# Patient Record
Sex: Male | Born: 1935
Health system: Southern US, Community
[De-identification: ages and names within clinical notes are randomized; demographics above are authoritative.]

## PROBLEM LIST (undated history)

## (undated) DIAGNOSIS — K449 Diaphragmatic hernia without obstruction or gangrene: Secondary | ICD-10-CM

## (undated) DIAGNOSIS — C679 Malignant neoplasm of bladder, unspecified: Secondary | ICD-10-CM

## (undated) DIAGNOSIS — I2510 Atherosclerotic heart disease of native coronary artery without angina pectoris: Secondary | ICD-10-CM

## (undated) DIAGNOSIS — I10 Essential (primary) hypertension: Secondary | ICD-10-CM

## (undated) DIAGNOSIS — K760 Fatty (change of) liver, not elsewhere classified: Secondary | ICD-10-CM

## (undated) DIAGNOSIS — N183 Chronic kidney disease, stage 3 unspecified: Secondary | ICD-10-CM

## (undated) DIAGNOSIS — I639 Cerebral infarction, unspecified: Secondary | ICD-10-CM

## (undated) DIAGNOSIS — E119 Type 2 diabetes mellitus without complications: Secondary | ICD-10-CM

## (undated) DIAGNOSIS — E7800 Pure hypercholesterolemia, unspecified: Secondary | ICD-10-CM

## (undated) HISTORY — DX: Atherosclerotic heart disease of native coronary artery without angina pectoris: I25.10

## (undated) HISTORY — DX: Fatty (change of) liver, not elsewhere classified: K76.0

## (undated) HISTORY — DX: Diaphragmatic hernia without obstruction or gangrene: K44.9

## (undated) HISTORY — DX: Chronic kidney disease, stage 3 unspecified: N18.30

## (undated) HISTORY — PX: HERNIA REPAIR: SHX51

## (undated) HISTORY — PX: APPENDECTOMY: SHX54

---

## 1999-07-29 ENCOUNTER — Encounter: Admission: RE | Admit: 1999-07-29 | Discharge: 1999-10-27 | Payer: Self-pay | Admitting: Internal Medicine

## 2001-02-12 ENCOUNTER — Ambulatory Visit (HOSPITAL_COMMUNITY): Admission: RE | Admit: 2001-02-12 | Discharge: 2001-02-12 | Payer: Self-pay | Admitting: Internal Medicine

## 2001-02-12 ENCOUNTER — Encounter: Payer: Self-pay | Admitting: Internal Medicine

## 2001-10-16 ENCOUNTER — Ambulatory Visit (HOSPITAL_COMMUNITY): Admission: RE | Admit: 2001-10-16 | Discharge: 2001-10-16 | Payer: Self-pay | Admitting: Internal Medicine

## 2001-11-02 ENCOUNTER — Ambulatory Visit (HOSPITAL_COMMUNITY): Admission: RE | Admit: 2001-11-02 | Discharge: 2001-11-02 | Payer: Self-pay | Admitting: Internal Medicine

## 2001-11-02 ENCOUNTER — Encounter: Payer: Self-pay | Admitting: Internal Medicine

## 2005-02-07 ENCOUNTER — Encounter (INDEPENDENT_AMBULATORY_CARE_PROVIDER_SITE_OTHER): Payer: Self-pay | Admitting: *Deleted

## 2005-02-07 ENCOUNTER — Ambulatory Visit (HOSPITAL_COMMUNITY): Admission: RE | Admit: 2005-02-07 | Discharge: 2005-02-08 | Payer: Self-pay | Admitting: Urology

## 2005-05-25 ENCOUNTER — Encounter (INDEPENDENT_AMBULATORY_CARE_PROVIDER_SITE_OTHER): Payer: Self-pay | Admitting: Specialist

## 2005-05-25 ENCOUNTER — Ambulatory Visit (HOSPITAL_BASED_OUTPATIENT_CLINIC_OR_DEPARTMENT_OTHER): Admission: RE | Admit: 2005-05-25 | Discharge: 2005-05-25 | Payer: Self-pay | Admitting: Urology

## 2005-08-18 ENCOUNTER — Encounter (HOSPITAL_COMMUNITY): Admission: RE | Admit: 2005-08-18 | Discharge: 2005-09-17 | Payer: Self-pay | Admitting: Internal Medicine

## 2007-03-29 ENCOUNTER — Ambulatory Visit (HOSPITAL_COMMUNITY): Admission: RE | Admit: 2007-03-29 | Discharge: 2007-03-29 | Payer: Self-pay | Admitting: General Surgery

## 2009-12-18 ENCOUNTER — Ambulatory Visit (HOSPITAL_COMMUNITY): Admission: RE | Admit: 2009-12-18 | Discharge: 2009-12-18 | Payer: Self-pay | Admitting: Internal Medicine

## 2009-12-21 ENCOUNTER — Ambulatory Visit (HOSPITAL_COMMUNITY): Admission: RE | Admit: 2009-12-21 | Discharge: 2009-12-21 | Payer: Self-pay | Admitting: Internal Medicine

## 2010-09-07 NOTE — H&P (Signed)
NAME:  Alexander Clayton, Alexander Clayton NO.:  [id number]   MEDICAL RECORD NO.:  [id number]          PATIENT TYPE:  AMB   LOCATION:  DAY                           FACILITY:  [facility]   PHYSICIAN:  Dalia Heading, M.D.  DATE OF BIRTH:  10-28-1927   DATE OF ADMISSION:  DATE OF DISCHARGE:  LH                              HISTORY & PHYSICAL   CHIEF COMPLAINT:  Need for screening colonoscopy.   HISTORY OF PRESENT ILLNESS:  The patient is a 77 year old white male who  is referred for endoscopic evaluation.  He needs a colonoscopy for  screening purposes.  No abdominal pain, weight loss, nausea, vomiting,  diarrhea, constipation, melena, hematochezia have been noted.  He last  had a colonoscopy 10 years ago.  There is no family history of colon  carcinoma.   PAST MEDICAL HISTORY:  1. Non-insulin-dependant diabetes mellitus.  2. History of bladder cancer.   PAST SURGICAL HISTORY:  1. Bladder cancer surgery.  2. Salivary gland removal.   CURRENT MEDICATIONS:  Metformin, glipizide, loratadine, losartan,  amlodipine, baby aspirin, simvastatin.   ALLERGIES:  1. VIOXX.  2. CELEBREX.   SOCIAL HISTORY:  The patient denies drinking or smoking.   PHYSICAL EXAMINATION:  GENERAL:  The patient is a well-developed, well-  nourished white male in no acute distress.  LUNGS:  Clear to auscultation.  Equal breath sounds bilaterally.  CARDIOVASCULAR:  Heart examination reveals a regular rate and rhythm  without S3, S4, murmurs.  ABDOMEN:  Soft, nontender, nondistended.  No hepatosplenomegaly or  masses noted.  RECTAL:  Examination was deferred to the procedure.   IMPRESSION:  Need for screening colonoscopy.   PLAN:  The patient is scheduled for a colonoscopy on 03/29/2007.  The  risks and benefits of the procedure including bleeding and perforation  were fully explained to the patient who gave informed consent.      Dalia Heading, M.D.  Electronically Signed     MAJ/MEDQ  D:   03/15/2007  T:  03/16/2007  Job:  [id number]   cc:   Kingsley Callander. Ouida Sills, MD  Fax: [phone]

## 2010-09-07 NOTE — H&P (Signed)
NAME:  Alexander Clayton, Alexander Clayton NO.:  [id number]   MEDICAL RECORD NO.:  [id number]          PATIENT TYPE:  AMB   LOCATION:  DAY                           FACILITY:  [facility]   PHYSICIAN:  Dalia Heading, M.D.  DATE OF BIRTH:  Nov 29, 1927   DATE OF ADMISSION:  DATE OF DISCHARGE:  LH                              HISTORY & PHYSICAL   CHIEF COMPLAINT:  Need for screening colonoscopy.   HISTORY OF PRESENT ILLNESS:  The patient is a 77 year old white male who  is referred for endoscopic evaluation.  He needs a colonoscopy for  screening purposes.  No abdominal pain, weight loss, nausea, vomiting,  diarrhea, constipation, melena, hematochezia have been noted.  He last  had a colonoscopy 10 years ago.  There is no family history of colon  carcinoma.   PAST MEDICAL HISTORY:  1. Non-insulin-dependant diabetes mellitus.  2. History of bladder cancer.   PAST SURGICAL HISTORY:  1. Bladder cancer surgery.  2. Salivary gland removal.   CURRENT MEDICATIONS:  Metformin, glipizide, loratadine, losartan,  amlodipine, baby aspirin, simvastatin.   ALLERGIES:  1. VIOXX.  2. CELEBREX.   SOCIAL HISTORY:  The patient denies drinking or smoking.   PHYSICAL EXAMINATION:  GENERAL:  The patient is a well-developed, well-  nourished white male in no acute distress.  LUNGS:  Clear to auscultation.  Equal breath sounds bilaterally.  CARDIOVASCULAR:  Heart examination reveals a regular rate and rhythm  without S3, S4, murmurs.  ABDOMEN:  Soft, nontender, nondistended.  No hepatosplenomegaly or  masses noted.  RECTAL:  Examination was deferred to the procedure.   IMPRESSION:  Need for screening colonoscopy.   PLAN:  The patient is scheduled for a colonoscopy on 03/29/2007.  The  risks and benefits of the procedure including bleeding and perforation  were fully explained to the patient who gave informed consent.      Dalia Heading, M.D.  Electronically Signed     MAJ/MEDQ  D:   03/15/2007  T:  03/16/2007  Job:  [id number]   cc:   Kingsley Callander. Ouida Sills, MD  Fax: [phone]

## 2010-09-10 NOTE — Op Note (Signed)
NAME:  Alexander Clayton, Alexander Clayton NO.:  [id number]   MEDICAL RECORD NO.:  [id number]          PATIENT TYPE:  AMB   LOCATION:  [facility]                         FACILITY:  [facility]   PHYSICIAN:  Ronald L. Earlene Plater, M.D.  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   OPERATIVE PROCEDURE:  1.  Cystourethroscopy.  2.  Bladder biopsies x3.   SURGEON:  Lucrezia Starch. Earlene Plater, M.D.   ANESTHESIA:  LMA.   ESTIMATED BLOOD LOSS:  Negligible.   TUBES:  None.   COMPLICATIONS:  None.   INDICATIONS FOR PROCEDURE:  [name] is a very nice 77 year old white  male who presented with a significant bladder tumor. It was 2.4 cm and  required resection.  He subsequently has undergone intravesical BCG which he  has completed; and is following 6-weeks of the episode of BCG and has  elected to proceed with bladder biopsies.  After understanding the risks,  benefits, and alternatives he elected to proceed.   PROCEDURE IN DETAIL:  The patient in detail.  The patient was placed in the  supine position after proper LMA anesthesia, and was placed in the dorsal  lithotomy position and prepped and draped with Betadine in usual fashion.  Cystourethroscopy was performed with a 22.5 French Olympus panendoscope  utilizing the 12 and 70-degree lens the bladder was carefully inspected.  There was mild trilobar hypertrophy.  Grade 1 trabeculation was noted of the  bladder.  Efflux of clear urine was noted from the normally placed ureteral  orifices bilaterally, and there were no lesions noted in the bladder.  The  urethral biopsies were obtained in the posterior midline, and the right and  left bladder walls.  The left bladder wall was near where the tumor was  originally resected, and submitted to pathology.  The bases  were cauterized  with Bugbee coagulation cautery and bladder washings were then performed  with normal saline and submitted for cytology.  Reinspection revealed no  further lesions.  Good hemostasis was present.  The bladder was drained and  the panendoscope was removed; and the patient was taken to the recovery room  stable.      Ronald L. Earlene Plater, M.D.  Electronically Signed    RLD/MEDQ  D:  05/25/2005  T:  05/25/2005  Job:  [id number]

## 2010-09-10 NOTE — Op Note (Signed)
NAME:  Alexander Clayton, Alexander Clayton NO.:  [id number]   MEDICAL RECORD NO.:  [id number]          PATIENT TYPE:  AMB   LOCATION:  DAY                          FACILITY:  [facility]   PHYSICIAN:  Ronald L. Earlene Plater, M.D.  DATE OF BIRTH:  Oct 25, 1927   DATE OF PROCEDURE:  02/07/2005  DATE OF DISCHARGE:                                 OPERATIVE REPORT   PREOPERATIVE DIAGNOSIS:  Bladder tumor.   POSTOPERATIVE DIAGNOSIS:  Bladder tumor.   OPERATION PERFORMED:  Cysto, transurethral resection of bladder tumor.   SURGEON:  Lucrezia Starch. Earlene Plater, M.D.   ANESTHESIA:  General endotracheal.   ESTIMATED BLOOD LOSS:  Negligible.   TUBES:  22 French Foley catheter.   COMPLICATIONS:  None.   INDICATIONS FOR PROCEDURE:  [name] is a very nice 77 year old white  male who came in for evaluation.  He was found to have some decreased force  of stream.  He had no gross hematuria.  Strong family history of prostate  cancer.  On examination, he was found to have asymptomatic microhematuria.  On a CT scan of the abdomen and pelvis, there was a questionable 2.5 cm  bladder lesion. Cystourethroscopy confirmed the posterior bladder wall tumor  that was approximately 2.5 cm in diameter.  After understanding risks,  benefits and alternatives, he has elected to proceed with the above  procedure.   DESCRIPTION OF PROCEDURE:  The patient was placed in supine position.  After  proper general endotracheal anesthesia, he was placed in dorsal lithotomy  position and prepped with Betadine in sterile fashion.  The urethra was  calibrated to 30 [place] with [employer] and a 28 French Olympus  continuous flow resectoscope sheath was placed over a Timberlake obturator.  Inspection of the bladder revealed approximately 2.5 cm some what sessile  but what appeared to be exophytic tumor on the posterior bladder wall.  The  trigone was totally spared.  There was some slight erythema around the  tumor.  Utilizing the  12 and 70 degree lenses, the entire bladder was  inspected and no other lesions were noted to be present. There was moderate  trilobar hypertrophy.  Utilizing the working element and a 12 degree lens,  the tumor was serially resected to the base and a biopsy was taken from the  base with cold cup biopsy forceps and submitted separately.  The tumor was  submitted.  Hemostasis was obtained with Bovie coagulation cautery.  No  other lesions were noted to be present.  Efflux of clear urine was noted  from normally  placed ureteral orifices bilaterally.  There were no other lesions of the  bladder and urethra.  The resectoscope sheath was visually removed and a 22  French 10 mL balloon Foley catheter was passed.  Urine was clear.  The  patient was then transferred to the recovery room in stable condition.      Ronald L. Earlene Plater, M.D.  Electronically Signed     RLD/MEDQ  D:  02/07/2005  T:  02/07/2005  Job:  [id number]

## 2010-09-10 NOTE — Procedures (Signed)
NAME:  Alexander Clayton, SHEWELL NO.:  [id number]   MEDICAL RECORD NO.:  [id number]          PATIENT TYPE:  REC   LOCATION:                                FACILITY:  [facility]   PHYSICIAN:  Kingsley Callander. Ouida Sills, MD       DATE OF BIRTH:  03-Sep-1927   DATE OF PROCEDURE:  08/18/2005  DATE OF DISCHARGE:                                    STRESS TEST   Mr. Minahan underwent a Myoview stress test for evaluation of recent  symptoms of chest pressure and pain.  He exercised 7 minutes 2 seconds (1  minute 2 seconds at the stage III of the Bruce protocol) attaining a maximal  heart rate of 149 (103% of the age predicted maximal heart rate) at a work  load of 8.5 METS and discontinued exercise after experiencing leg stiffness.  There were no arrhythmias.  There were no symptoms of chest pain.  There  were no ST segment changes diagnostic of ischemia.  The baseline  electrocardiogram revealed normal sinus rhythm at 63 beats per minute.  There were small inferior Q-waves.  There was delayed R-wave progression.   IMPRESSION:  No evidence of exercise-induced ischemia.  Myoview images are  pending.      Kingsley Callander. Ouida Sills, MD  Electronically Signed     ROF/MEDQ  D:  08/18/2005  T:  08/19/2005  Job:  [id number]

## 2010-09-10 NOTE — Procedures (Signed)
[facility]  Patient:    Alexander Clayton, Alexander Clayton Visit Number: [id number] MRN: [id number]          Service Type: OUT Location: RAD Attending Physician:  Carylon Perches Dictated by:   Carylon Perches, M.D. Proc. Date: 10/16/01 Admit Date:  10/16/2001                                Stress Test  The patient exercised 7 minutes 25 seconds (1 minute 25 seconds into stage III of the Bruce protocol, although the speed was reduced to 3 miles per hour to allow him to continue walking), attaining a maximal heart rate of 160 (107% of the age predicted maximal heart rate) at a work load of 9.1 METs and discontinued exercise due to fatigue.  There were no symptoms of chest pain. There were no arrhythmias.  Baseline electrocardiogram reveals normal sinus rhythm at 68 beats per minute with early repolarization changes in the inferolateral leads.  High voltage was present in the inferior leads as well. With exercise, he developed approximately 1 to 2 mm ST segment depression in the inferolateral leads with associated baseline wandering.  IMPRESSION:  Borderline exercise stress test.  Cardiolite images pending. Dictated by:   Carylon Perches, M.D. Attending Physician:  Carylon Perches DD:  10/16/01 TD:  10/16/01 Job: [id number]

## 2011-05-09 DIAGNOSIS — N400 Enlarged prostate without lower urinary tract symptoms: Secondary | ICD-10-CM | POA: Insufficient documentation

## 2013-12-18 ENCOUNTER — Ambulatory Visit: Payer: Self-pay | Admitting: Family Medicine

## 2013-12-18 ENCOUNTER — Telehealth: Payer: Self-pay | Admitting: Family Medicine

## 2013-12-20 NOTE — Telephone Encounter (Signed)
Error

## 2014-04-08 ENCOUNTER — Emergency Department (HOSPITAL_COMMUNITY): Payer: Medicare Other

## 2014-04-08 ENCOUNTER — Emergency Department (HOSPITAL_COMMUNITY)
Admission: EM | Admit: 2014-04-08 | Discharge: 2014-04-08 | Disposition: A | Discharge status: Home / Self Care | Payer: Medicare Other | Attending: Emergency Medicine | Admitting: Emergency Medicine

## 2014-04-08 ENCOUNTER — Encounter (HOSPITAL_COMMUNITY): Payer: Self-pay | Admitting: *Deleted

## 2014-04-08 DIAGNOSIS — S2242XA Multiple fractures of ribs, left side, initial encounter for closed fracture: Secondary | ICD-10-CM | POA: Insufficient documentation

## 2014-04-08 DIAGNOSIS — Y998 Other external cause status: Secondary | ICD-10-CM | POA: Insufficient documentation

## 2014-04-08 DIAGNOSIS — I10 Essential (primary) hypertension: Secondary | ICD-10-CM | POA: Insufficient documentation

## 2014-04-08 DIAGNOSIS — S299XXA Unspecified injury of thorax, initial encounter: Secondary | ICD-10-CM | POA: Diagnosis present

## 2014-04-08 DIAGNOSIS — Y9289 Other specified places as the place of occurrence of the external cause: Secondary | ICD-10-CM | POA: Insufficient documentation

## 2014-04-08 DIAGNOSIS — Y9389 Activity, other specified: Secondary | ICD-10-CM | POA: Insufficient documentation

## 2014-04-08 DIAGNOSIS — W19XXXA Unspecified fall, initial encounter: Secondary | ICD-10-CM

## 2014-04-08 DIAGNOSIS — S2232XA Fracture of one rib, left side, initial encounter for closed fracture: Secondary | ICD-10-CM

## 2014-04-08 DIAGNOSIS — E119 Type 2 diabetes mellitus without complications: Secondary | ICD-10-CM | POA: Diagnosis not present

## 2014-04-08 DIAGNOSIS — W11XXXA Fall on and from ladder, initial encounter: Secondary | ICD-10-CM | POA: Diagnosis not present

## 2014-04-08 HISTORY — DX: Type 2 diabetes mellitus without complications: E11.9

## 2014-04-08 HISTORY — DX: Essential (primary) hypertension: I10

## 2014-04-08 LAB — CBG MONITORING, ED: GLUCOSE-CAPILLARY: 274 mg/dL — AB (ref 70–99)

## 2014-04-08 MED ORDER — OXYCODONE-ACETAMINOPHEN 5-325 MG PO TABS
2.0000 | ORAL_TABLET | Freq: Once | ORAL | Status: AC
  Administered 2014-04-08: 2 via ORAL
  Filled 2014-04-08: qty 2

## 2014-04-08 MED ORDER — DOCUSATE SODIUM 100 MG PO CAPS: 100.0000 mg | ORAL_CAPSULE | Freq: Two times a day (BID) | ORAL | Status: DC | PRN

## 2014-04-08 MED ORDER — OXYCODONE-ACETAMINOPHEN 5-325 MG PO TABS: 1.0000 | ORAL_TABLET | ORAL | Status: DC | PRN

## 2014-04-08 MED ORDER — IBUPROFEN 400 MG PO TABS
400.0000 mg | ORAL_TABLET | Freq: Once | ORAL | Status: AC
  Administered 2014-04-08: 400 mg via ORAL
  Filled 2014-04-08: qty 1

## 2014-04-08 NOTE — Discharge Instructions (Signed)

## 2014-04-08 NOTE — ED Notes (Signed)
Fell from ladder 4-5 feet , no LOC, fell onto piece of wood.  Pain low T upper L spine.  Alert,  No neck pain.  Took tylenol pta

## 2014-04-08 NOTE — ED Provider Notes (Addendum)
CSN: [id number]     Arrival date & time 04/08/14  1416 History  This chart was scribed for Virgel Manifold, MD by Edison Simon, ED Scribe. This patient was seen in room APA02/APA02 and the patient's care was started at 4:06 PM.    Chief Complaint  Patient presents with  . Fall   The history is provided by the patient. No language interpreter was used.    HPI Comments: Alexander Clayton is a 80 y.o. male who presents to the Emergency Department complaining of fall PTA. He states he was taking pine straw off a transfer truck and was coming down when the ladder collapsed and he struck his left back on a piece of wood. Happened shortly before arrival. Persistent pain since. Worse with pressure on same area and certain movement. No SOB. Did not strike head. No LOC. No HA. No acute, numbness, tingling or los of strength.    Past Medical History  Diagnosis Date  . Diabetes mellitus without complication   . Hypertension    Past Surgical History  Procedure Laterality Date  . Hernia repair    . Appendectomy     History reviewed. No pertinent family history. History  Substance Use Topics  . Smoking status: Never Smoker   . Smokeless tobacco: Not on file  . Alcohol Use: No    Review of Systems  Musculoskeletal: Positive for myalgias.  Neurological: Negative for numbness.      Allergies  Ciprofloxacin and Vioxx  Home Medications   Prior to Admission medications   Not on File   BP 133/56 mmHg  Pulse 83  Temp(Src) 98.1 F (36.7 C) (Oral)  Resp 18  Ht 5\' 8"  (1.727 m)  Wt 188 lb (85.276 kg)  BMI 28.59 kg/m2  SpO2 98% Physical Exam  Constitutional: He is oriented to person, place, and time. He appears well-developed and well-nourished.  HENT:  Head: Normocephalic and atraumatic.  Eyes: EOM are normal.  Neck: Normal range of motion.  Cardiovascular: Normal rate, regular rhythm, normal heart sounds and intact distal pulses.   Pulmonary/Chest: Effort normal and breath sounds  normal. No respiratory distress.  Abdominal: Soft. He exhibits no distension. There is no tenderness.  Musculoskeletal: Normal range of motion.  TTP lower thoracic region just left of midline. No overlying skin changes. No crepitus. Breath sounds clear/symmetric b/l.   Neurological: He is alert and oriented to person, place, and time.  Skin: Skin is warm and dry.  Psychiatric: He has a normal mood and affect. Judgment normal.  Nursing note and vitals reviewed.   ED Course  Procedures (including critical care time)  Definitve Fracture Care. Closed treatment of uncomplicated rib fracture: 2 (two):  Definitive fracture care was provided for a two closed rib fractures. Treatment including pain medication, discussion of possible complications including pneumonia, discussion of usage of incentive spirometry, and referral to primary care physician (our resource list provided) for further followup.   DIAGNOSTIC STUDIES: Oxygen Saturation is 98% on room air, normal by my interpretation.    COORDINATION OF CARE: 4:11 PM Discussed treatment plan with patient at beside, the patient agrees with the plan and has no further questions at this time.   Labs Review Labs Reviewed  CBG MONITORING, ED - Abnormal; Notable for the following:    Glucose-Capillary 274 (*)    All other components within normal limits    Imaging Review Dg Chest 2 View  04/08/2014   CLINICAL DATA:  Fall from ladder onto piece of  wood with back and chest pain, initial encounter  EXAM: CHEST  2 VIEW  COMPARISON:  None.  FINDINGS: Mildly displaced fractures of the ninth and tenth ribs on the left are noted laterally. No pneumothorax is seen. No other bony abnormality is noted. The cardiac shadow is within normal limits. No focal infiltrate is noted.  IMPRESSION: Left ninth and tenth rib fractures without complicating factors.   Electronically Signed   By: Inez Catalina M.D.   On: 04/08/2014 15:05   Dg Lumbar Spine  Complete  04/08/2014   CLINICAL DATA:  From ladder 4-5 feet with back pain, initial encounter  EXAM: LUMBAR SPINE - COMPLETE 4+ VIEW  COMPARISON:  01/26/2005.  FINDINGS: Five lumbar type vertebral bodies are well visualized. Mild osteophytic changes are seen. Degenerative anterolisthesis of L4 on L5 is noted. There is a compression deformity of T12 which is chronic in nature. No acute compression deformity is noted. No soft tissue changes are seen. Aortic calcifications are noted.  IMPRESSION: Degenerative anterolisthesis of L4 on L5.  Chronic T12 compression deformity.   Electronically Signed   By: Inez Catalina M.D.   On: 04/08/2014 15:16     EKG Interpretation None      MDM   Final diagnoses:  Left rib fracture, closed, initial encounter    86yF with closed L rib fxs. No respiratory distress. No midline tenderness. Nonfocal neuro exam. PRN pain meds. Incentive spirometry. Return precautions discussed. Oupt FU as needed otherwise.   I personally preformed the services scribed in my presence. The recorded information has been reviewed is accurate. Virgel Manifold, MD.   Virgel Manifold, MD 04/08/14 [phone]

## 2014-04-08 NOTE — ED Notes (Signed)
CBG 274 

## 2015-06-22 DIAGNOSIS — B351 Tinea unguium: Secondary | ICD-10-CM | POA: Diagnosis not present

## 2015-06-22 DIAGNOSIS — E1140 Type 2 diabetes mellitus with diabetic neuropathy, unspecified: Secondary | ICD-10-CM | POA: Diagnosis not present

## 2015-06-22 DIAGNOSIS — E1151 Type 2 diabetes mellitus with diabetic peripheral angiopathy without gangrene: Secondary | ICD-10-CM | POA: Diagnosis not present

## 2015-06-22 DIAGNOSIS — L600 Ingrowing nail: Secondary | ICD-10-CM | POA: Diagnosis not present

## 2015-07-02 DIAGNOSIS — H52223 Regular astigmatism, bilateral: Secondary | ICD-10-CM | POA: Diagnosis not present

## 2015-08-05 DIAGNOSIS — E119 Type 2 diabetes mellitus without complications: Secondary | ICD-10-CM | POA: Diagnosis not present

## 2015-08-13 DIAGNOSIS — R079 Chest pain, unspecified: Secondary | ICD-10-CM | POA: Diagnosis not present

## 2015-08-13 DIAGNOSIS — E119 Type 2 diabetes mellitus without complications: Secondary | ICD-10-CM | POA: Diagnosis not present

## 2015-08-13 DIAGNOSIS — I10 Essential (primary) hypertension: Secondary | ICD-10-CM | POA: Diagnosis not present

## 2015-08-13 DIAGNOSIS — Z6830 Body mass index (BMI) 30.0-30.9, adult: Secondary | ICD-10-CM | POA: Diagnosis not present

## 2015-08-17 ENCOUNTER — Encounter: Payer: Self-pay | Admitting: Internal Medicine

## 2015-08-17 ENCOUNTER — Ambulatory Visit (INDEPENDENT_AMBULATORY_CARE_PROVIDER_SITE_OTHER): Payer: Medicare Other | Admitting: Internal Medicine

## 2015-08-17 VITALS — BP 142/68 | HR 90 | Ht 67.5 in | Wt 195.0 lb

## 2015-08-17 DIAGNOSIS — R072 Precordial pain: Secondary | ICD-10-CM

## 2015-08-17 NOTE — Patient Instructions (Signed)
Your physician recommends that you schedule a follow-up appointment in: to be determined after test    HOLD morning Insulin dose   Your physician has requested that you have en exercise stress myoview. For further information please visit [email or web address]. Please follow instruction sheet, as given.   Thank you for choosing [facility] !

## 2015-08-17 NOTE — Progress Notes (Signed)
Cardiology Office Note   Date:  08/17/2015   ID:  Alexander Clayton, DOB 1927-09-29, MRN [id number]  PCP:  Asencion Noble, MD  Cardiologist:   Dorris Carnes, MD    Pt referred for CP     History of Present Illness: Alexander Clayton is a 82 y.o. male with a history of  Chest discomfort  Tries to walk 2 miles per day  Never has problems with walking  Will have pain while sitting or in back or shoulder    Dull tooth ache  No SOB No change in speed  Does it in 40 min       Outpatient Prescriptions Prior to Visit  Medication Sig Dispense Refill  . docusate sodium (COLACE) 100 MG capsule Take 1 capsule (100 mg total) by mouth 2 (two) times daily as needed for mild constipation. While taking pain medication 30 capsule 0  . oxyCODONE-acetaminophen (PERCOCET/ROXICET) 5-325 MG per tablet Take 1-2 tablets by mouth every 4 (four) hours as needed for severe pain. 30 tablet 0  . VICTOZA 18 MG/3ML SOPN   3   No facility-administered medications prior to visit.     Allergies:   Ciprofloxacin and Vioxx   Past Medical History  Diagnosis Date  . Diabetes mellitus without complication ([place])   . Hypertension     Past Surgical History  Procedure Laterality Date  . Hernia repair    . Appendectomy       Social History:  The patient  reports that he has never smoked. He does not have any smokeless tobacco history on file. He reports that he does not drink alcohol or use illicit drugs.   Family History:  The patient's family history is not on file. Paternal GF had aneurysm Father died of lung ca; mother died of heart disease  No heart dz in family  Hx strokes in family     ROS:  Please see the history of present illness. All other systems are reviewed and  Negative to the above problem except as noted.    PHYSICAL EXAM: VS:  BP 142/68 mmHg  Pulse 90  Ht 5' 7.5" (1.715 m)  Wt 195 lb (88.451 kg)  BMI 30.07 kg/m2  SpO2 95%  GEN: Well nourished, well developed, in no acute  distress HEENT: normal Neck: no JVD, carotid bruits, or masses Cardiac: RRR; no murmurs, rubs, or gallops,no edema  Respiratory:  clear to auscultation bilaterally, normal work of breathing GI: soft, nontender, nondistended, + BS  No hepatomegaly  MS: no deformity Moving all extremities   Skin: warm and dry, no rash Neuro:  Strength and sensation are intact Psych: euthymic mood, full affect   EKG:  EKG is not ordered today.  ON 4/20 SR 66     Lipid Panel No results found for: CHOL, TRIG, HDL, CHOLHDL, VLDL, LDLCALC, LDLDIRECT    Wt Readings from Last 3 Encounters:  08/17/15 195 lb (88.451 kg)  04/08/14 188 lb (85.276 kg)      ASSESSMENT AND PLAN:   1  CP  Pains are atypical but nagging, new   He is fairly active Walks about 2 miles in 40 min   Not having symptoms wthi activity  With long standing DM I would recomm another stress myoview  He had one 10 years ago when 77    2  HL  WIll review lipids  3  DM  Pt just increased agents    Signed, Dorris Carnes, MD  08/17/2015 8:56 AM  [facility] [address], [place], [place]  [address] Phone: [phone]; Fax: [phone]

## 2015-08-21 ENCOUNTER — Encounter (HOSPITAL_COMMUNITY)
Admission: RE | Admit: 2015-08-21 | Discharge: 2015-08-21 | Disposition: A | Discharge status: Home / Self Care | Payer: Medicare Other | Source: Ambulatory Visit | Attending: Internal Medicine | Admitting: Internal Medicine

## 2015-08-21 ENCOUNTER — Encounter (HOSPITAL_COMMUNITY): Payer: Self-pay

## 2015-08-21 ENCOUNTER — Inpatient Hospital Stay (HOSPITAL_COMMUNITY): Admission: RE | Admit: 2015-08-21 | Payer: Medicare Other | Source: Ambulatory Visit

## 2015-08-21 DIAGNOSIS — R072 Precordial pain: Secondary | ICD-10-CM | POA: Diagnosis not present

## 2015-08-21 LAB — NM MYOCAR MULTI W/SPECT W/WALL MOTION / EF
CHL CUP MPHR: 135 {beats}/min
CHL CUP NUCLEAR SRS: 1
CHL RATE OF PERCEIVED EXERTION: 11
CSEPEDS: 52 s
CSEPEW: 4.6 METS
Exercise duration (min): 2 min
LV sys vol: 29 mL
LVDIAVOL: 66 mL (ref 62–150)
NUC STRESS TID: 0.98
Peak HR: 136 {beats}/min
Percent HR: 100 %
RATE: 0.32
Rest HR: 65 {beats}/min
SDS: 0
SSS: 1

## 2015-08-21 MED ORDER — TECHNETIUM TC 99M SESTAMIBI - CARDIOLITE
30.0000 | Freq: Once | INTRAVENOUS | Status: AC | PRN
  Administered 2015-08-21: 30 via INTRAVENOUS

## 2015-08-21 MED ORDER — TECHNETIUM TC 99M SESTAMIBI GENERIC - CARDIOLITE
10.0000 | Freq: Once | INTRAVENOUS | Status: AC | PRN
  Administered 2015-08-21: 10 via INTRAVENOUS

## 2015-08-21 MED ORDER — REGADENOSON 0.4 MG/5ML IV SOLN
INTRAVENOUS | Status: AC
  Filled 2015-08-21: qty 5

## 2015-08-21 MED ORDER — SODIUM CHLORIDE 0.9% FLUSH
INTRAVENOUS | Status: AC
  Administered 2015-08-21: 10 mL via INTRAVENOUS
  Filled 2015-08-21: qty 10

## 2015-10-28 DIAGNOSIS — M1712 Unilateral primary osteoarthritis, left knee: Secondary | ICD-10-CM | POA: Diagnosis not present

## 2015-11-06 DIAGNOSIS — M1712 Unilateral primary osteoarthritis, left knee: Secondary | ICD-10-CM | POA: Diagnosis not present

## 2015-11-13 DIAGNOSIS — M1712 Unilateral primary osteoarthritis, left knee: Secondary | ICD-10-CM | POA: Diagnosis not present

## 2015-12-07 DIAGNOSIS — C679 Malignant neoplasm of bladder, unspecified: Secondary | ICD-10-CM | POA: Diagnosis not present

## 2015-12-07 DIAGNOSIS — Z125 Encounter for screening for malignant neoplasm of prostate: Secondary | ICD-10-CM | POA: Diagnosis not present

## 2015-12-07 DIAGNOSIS — I10 Essential (primary) hypertension: Secondary | ICD-10-CM | POA: Diagnosis not present

## 2015-12-07 DIAGNOSIS — E785 Hyperlipidemia, unspecified: Secondary | ICD-10-CM | POA: Diagnosis not present

## 2015-12-07 DIAGNOSIS — Z79899 Other long term (current) drug therapy: Secondary | ICD-10-CM | POA: Diagnosis not present

## 2015-12-07 DIAGNOSIS — M1990 Unspecified osteoarthritis, unspecified site: Secondary | ICD-10-CM | POA: Diagnosis not present

## 2015-12-21 DIAGNOSIS — C672 Malignant neoplasm of lateral wall of bladder: Secondary | ICD-10-CM | POA: Diagnosis not present

## 2016-01-01 DIAGNOSIS — E785 Hyperlipidemia, unspecified: Secondary | ICD-10-CM | POA: Diagnosis not present

## 2016-01-01 DIAGNOSIS — R079 Chest pain, unspecified: Secondary | ICD-10-CM | POA: Diagnosis not present

## 2016-01-01 DIAGNOSIS — E119 Type 2 diabetes mellitus without complications: Secondary | ICD-10-CM | POA: Diagnosis not present

## 2016-01-04 DIAGNOSIS — E119 Type 2 diabetes mellitus without complications: Secondary | ICD-10-CM | POA: Diagnosis not present

## 2016-04-01 DIAGNOSIS — E119 Type 2 diabetes mellitus without complications: Secondary | ICD-10-CM | POA: Diagnosis not present

## 2016-04-01 DIAGNOSIS — Z79899 Other long term (current) drug therapy: Secondary | ICD-10-CM | POA: Diagnosis not present

## 2016-04-01 DIAGNOSIS — E785 Hyperlipidemia, unspecified: Secondary | ICD-10-CM | POA: Diagnosis not present

## 2016-04-08 DIAGNOSIS — E119 Type 2 diabetes mellitus without complications: Secondary | ICD-10-CM | POA: Diagnosis not present

## 2016-04-08 DIAGNOSIS — E785 Hyperlipidemia, unspecified: Secondary | ICD-10-CM | POA: Diagnosis not present

## 2016-04-08 DIAGNOSIS — E039 Hypothyroidism, unspecified: Secondary | ICD-10-CM | POA: Diagnosis not present

## 2016-04-08 DIAGNOSIS — I10 Essential (primary) hypertension: Secondary | ICD-10-CM | POA: Diagnosis not present

## 2016-06-29 DIAGNOSIS — M1712 Unilateral primary osteoarthritis, left knee: Secondary | ICD-10-CM | POA: Diagnosis not present

## 2016-07-06 DIAGNOSIS — M1712 Unilateral primary osteoarthritis, left knee: Secondary | ICD-10-CM | POA: Diagnosis not present

## 2016-07-13 DIAGNOSIS — M1712 Unilateral primary osteoarthritis, left knee: Secondary | ICD-10-CM | POA: Diagnosis not present

## 2016-08-02 DIAGNOSIS — Z79899 Other long term (current) drug therapy: Secondary | ICD-10-CM | POA: Diagnosis not present

## 2016-08-02 DIAGNOSIS — E119 Type 2 diabetes mellitus without complications: Secondary | ICD-10-CM | POA: Diagnosis not present

## 2016-08-02 DIAGNOSIS — E039 Hypothyroidism, unspecified: Secondary | ICD-10-CM | POA: Diagnosis not present

## 2016-08-09 DIAGNOSIS — E039 Hypothyroidism, unspecified: Secondary | ICD-10-CM | POA: Diagnosis not present

## 2016-08-09 DIAGNOSIS — E119 Type 2 diabetes mellitus without complications: Secondary | ICD-10-CM | POA: Diagnosis not present

## 2016-08-09 DIAGNOSIS — E785 Hyperlipidemia, unspecified: Secondary | ICD-10-CM | POA: Diagnosis not present

## 2016-08-09 DIAGNOSIS — Z6829 Body mass index (BMI) 29.0-29.9, adult: Secondary | ICD-10-CM | POA: Diagnosis not present

## 2016-11-01 DIAGNOSIS — Z79899 Other long term (current) drug therapy: Secondary | ICD-10-CM | POA: Diagnosis not present

## 2016-11-01 DIAGNOSIS — E039 Hypothyroidism, unspecified: Secondary | ICD-10-CM | POA: Diagnosis not present

## 2016-11-01 DIAGNOSIS — E119 Type 2 diabetes mellitus without complications: Secondary | ICD-10-CM | POA: Diagnosis not present

## 2016-11-08 DIAGNOSIS — E1140 Type 2 diabetes mellitus with diabetic neuropathy, unspecified: Secondary | ICD-10-CM | POA: Diagnosis not present

## 2016-11-08 DIAGNOSIS — E039 Hypothyroidism, unspecified: Secondary | ICD-10-CM | POA: Diagnosis not present

## 2016-11-08 DIAGNOSIS — I10 Essential (primary) hypertension: Secondary | ICD-10-CM | POA: Diagnosis not present

## 2017-01-02 DIAGNOSIS — N138 Other obstructive and reflux uropathy: Secondary | ICD-10-CM | POA: Diagnosis not present

## 2017-01-02 DIAGNOSIS — N401 Enlarged prostate with lower urinary tract symptoms: Secondary | ICD-10-CM | POA: Diagnosis not present

## 2017-01-02 DIAGNOSIS — Z8551 Personal history of malignant neoplasm of bladder: Secondary | ICD-10-CM | POA: Diagnosis not present

## 2017-02-08 DIAGNOSIS — Z79899 Other long term (current) drug therapy: Secondary | ICD-10-CM | POA: Diagnosis not present

## 2017-02-08 DIAGNOSIS — E1149 Type 2 diabetes mellitus with other diabetic neurological complication: Secondary | ICD-10-CM | POA: Diagnosis not present

## 2017-02-08 DIAGNOSIS — I10 Essential (primary) hypertension: Secondary | ICD-10-CM | POA: Diagnosis not present

## 2017-02-08 DIAGNOSIS — E039 Hypothyroidism, unspecified: Secondary | ICD-10-CM | POA: Diagnosis not present

## 2017-02-15 DIAGNOSIS — Z23 Encounter for immunization: Secondary | ICD-10-CM | POA: Diagnosis not present

## 2017-02-15 DIAGNOSIS — E039 Hypothyroidism, unspecified: Secondary | ICD-10-CM | POA: Diagnosis not present

## 2017-02-15 DIAGNOSIS — E1140 Type 2 diabetes mellitus with diabetic neuropathy, unspecified: Secondary | ICD-10-CM | POA: Diagnosis not present

## 2017-02-15 DIAGNOSIS — I10 Essential (primary) hypertension: Secondary | ICD-10-CM | POA: Diagnosis not present

## 2017-04-20 DIAGNOSIS — N390 Urinary tract infection, site not specified: Secondary | ICD-10-CM | POA: Diagnosis not present

## 2017-04-20 DIAGNOSIS — N3000 Acute cystitis without hematuria: Secondary | ICD-10-CM | POA: Diagnosis not present

## 2017-06-07 DIAGNOSIS — E1140 Type 2 diabetes mellitus with diabetic neuropathy, unspecified: Secondary | ICD-10-CM | POA: Diagnosis not present

## 2017-06-07 DIAGNOSIS — M1712 Unilateral primary osteoarthritis, left knee: Secondary | ICD-10-CM | POA: Diagnosis not present

## 2017-06-14 DIAGNOSIS — M1712 Unilateral primary osteoarthritis, left knee: Secondary | ICD-10-CM | POA: Diagnosis not present

## 2017-06-19 DIAGNOSIS — E1140 Type 2 diabetes mellitus with diabetic neuropathy, unspecified: Secondary | ICD-10-CM | POA: Diagnosis not present

## 2017-06-19 DIAGNOSIS — I10 Essential (primary) hypertension: Secondary | ICD-10-CM | POA: Diagnosis not present

## 2017-06-19 DIAGNOSIS — Z6831 Body mass index (BMI) 31.0-31.9, adult: Secondary | ICD-10-CM | POA: Diagnosis not present

## 2017-06-21 DIAGNOSIS — M1712 Unilateral primary osteoarthritis, left knee: Secondary | ICD-10-CM | POA: Diagnosis not present

## 2017-07-17 DIAGNOSIS — H43813 Vitreous degeneration, bilateral: Secondary | ICD-10-CM | POA: Diagnosis not present

## 2017-07-17 DIAGNOSIS — Z961 Presence of intraocular lens: Secondary | ICD-10-CM | POA: Diagnosis not present

## 2017-07-17 DIAGNOSIS — E119 Type 2 diabetes mellitus without complications: Secondary | ICD-10-CM | POA: Diagnosis not present

## 2017-07-17 DIAGNOSIS — H353132 Nonexudative age-related macular degeneration, bilateral, intermediate dry stage: Secondary | ICD-10-CM | POA: Diagnosis not present

## 2017-10-17 DIAGNOSIS — E039 Hypothyroidism, unspecified: Secondary | ICD-10-CM | POA: Diagnosis not present

## 2017-10-17 DIAGNOSIS — I10 Essential (primary) hypertension: Secondary | ICD-10-CM | POA: Diagnosis not present

## 2017-10-17 DIAGNOSIS — E1140 Type 2 diabetes mellitus with diabetic neuropathy, unspecified: Secondary | ICD-10-CM | POA: Diagnosis not present

## 2017-10-17 DIAGNOSIS — E785 Hyperlipidemia, unspecified: Secondary | ICD-10-CM | POA: Diagnosis not present

## 2017-10-23 DIAGNOSIS — E785 Hyperlipidemia, unspecified: Secondary | ICD-10-CM | POA: Diagnosis not present

## 2017-10-23 DIAGNOSIS — E1129 Type 2 diabetes mellitus with other diabetic kidney complication: Secondary | ICD-10-CM | POA: Diagnosis not present

## 2017-10-23 DIAGNOSIS — E039 Hypothyroidism, unspecified: Secondary | ICD-10-CM | POA: Diagnosis not present

## 2017-12-08 DIAGNOSIS — H43812 Vitreous degeneration, left eye: Secondary | ICD-10-CM | POA: Diagnosis not present

## 2017-12-08 DIAGNOSIS — H26492 Other secondary cataract, left eye: Secondary | ICD-10-CM | POA: Diagnosis not present

## 2017-12-08 DIAGNOSIS — H353132 Nonexudative age-related macular degeneration, bilateral, intermediate dry stage: Secondary | ICD-10-CM | POA: Diagnosis not present

## 2017-12-08 DIAGNOSIS — Z961 Presence of intraocular lens: Secondary | ICD-10-CM | POA: Diagnosis not present

## 2018-01-05 DIAGNOSIS — Z8551 Personal history of malignant neoplasm of bladder: Secondary | ICD-10-CM | POA: Diagnosis not present

## 2018-01-05 DIAGNOSIS — R972 Elevated prostate specific antigen [PSA]: Secondary | ICD-10-CM | POA: Diagnosis not present

## 2018-01-05 DIAGNOSIS — N138 Other obstructive and reflux uropathy: Secondary | ICD-10-CM | POA: Diagnosis not present

## 2018-01-05 DIAGNOSIS — N401 Enlarged prostate with lower urinary tract symptoms: Secondary | ICD-10-CM | POA: Diagnosis not present

## 2018-01-18 DIAGNOSIS — H26492 Other secondary cataract, left eye: Secondary | ICD-10-CM | POA: Diagnosis not present

## 2018-01-29 DIAGNOSIS — Z23 Encounter for immunization: Secondary | ICD-10-CM | POA: Diagnosis not present

## 2018-02-26 DIAGNOSIS — E1129 Type 2 diabetes mellitus with other diabetic kidney complication: Secondary | ICD-10-CM | POA: Diagnosis not present

## 2018-02-26 DIAGNOSIS — E039 Hypothyroidism, unspecified: Secondary | ICD-10-CM | POA: Diagnosis not present

## 2018-02-26 DIAGNOSIS — E785 Hyperlipidemia, unspecified: Secondary | ICD-10-CM | POA: Diagnosis not present

## 2018-02-26 DIAGNOSIS — I10 Essential (primary) hypertension: Secondary | ICD-10-CM | POA: Diagnosis not present

## 2018-04-26 DIAGNOSIS — H52203 Unspecified astigmatism, bilateral: Secondary | ICD-10-CM | POA: Diagnosis not present

## 2018-05-23 DIAGNOSIS — E1129 Type 2 diabetes mellitus with other diabetic kidney complication: Secondary | ICD-10-CM | POA: Diagnosis not present

## 2018-05-30 DIAGNOSIS — E1129 Type 2 diabetes mellitus with other diabetic kidney complication: Secondary | ICD-10-CM | POA: Diagnosis not present

## 2018-05-30 DIAGNOSIS — I10 Essential (primary) hypertension: Secondary | ICD-10-CM | POA: Diagnosis not present

## 2018-09-03 ENCOUNTER — Encounter (HOSPITAL_COMMUNITY): Payer: Self-pay

## 2018-09-03 ENCOUNTER — Emergency Department (HOSPITAL_COMMUNITY): Payer: Medicare Other

## 2018-09-03 ENCOUNTER — Other Ambulatory Visit: Payer: Self-pay

## 2018-09-03 ENCOUNTER — Observation Stay (HOSPITAL_COMMUNITY)
Admission: EM | Admit: 2018-09-03 | Discharge: 2018-09-04 | Disposition: A | Discharge status: Home / Self Care | Payer: Medicare Other | Attending: Internal Medicine | Admitting: Internal Medicine

## 2018-09-03 DIAGNOSIS — Z79899 Other long term (current) drug therapy: Secondary | ICD-10-CM | POA: Insufficient documentation

## 2018-09-03 DIAGNOSIS — E039 Hypothyroidism, unspecified: Secondary | ICD-10-CM | POA: Diagnosis not present

## 2018-09-03 DIAGNOSIS — E785 Hyperlipidemia, unspecified: Secondary | ICD-10-CM | POA: Insufficient documentation

## 2018-09-03 DIAGNOSIS — N183 Chronic kidney disease, stage 3 unspecified: Secondary | ICD-10-CM

## 2018-09-03 DIAGNOSIS — Z7982 Long term (current) use of aspirin: Secondary | ICD-10-CM | POA: Insufficient documentation

## 2018-09-03 DIAGNOSIS — Z8551 Personal history of malignant neoplasm of bladder: Secondary | ICD-10-CM | POA: Insufficient documentation

## 2018-09-03 DIAGNOSIS — Z794 Long term (current) use of insulin: Secondary | ICD-10-CM | POA: Insufficient documentation

## 2018-09-03 DIAGNOSIS — E1122 Type 2 diabetes mellitus with diabetic chronic kidney disease: Secondary | ICD-10-CM | POA: Insufficient documentation

## 2018-09-03 DIAGNOSIS — Z1159 Encounter for screening for other viral diseases: Secondary | ICD-10-CM | POA: Diagnosis not present

## 2018-09-03 DIAGNOSIS — Z20828 Contact with and (suspected) exposure to other viral communicable diseases: Secondary | ICD-10-CM | POA: Diagnosis not present

## 2018-09-03 DIAGNOSIS — E1165 Type 2 diabetes mellitus with hyperglycemia: Secondary | ICD-10-CM | POA: Diagnosis not present

## 2018-09-03 DIAGNOSIS — R079 Chest pain, unspecified: Secondary | ICD-10-CM

## 2018-09-03 DIAGNOSIS — I129 Hypertensive chronic kidney disease with stage 1 through stage 4 chronic kidney disease, or unspecified chronic kidney disease: Secondary | ICD-10-CM | POA: Diagnosis not present

## 2018-09-03 DIAGNOSIS — R0789 Other chest pain: Secondary | ICD-10-CM | POA: Diagnosis not present

## 2018-09-03 DIAGNOSIS — R0602 Shortness of breath: Secondary | ICD-10-CM | POA: Diagnosis not present

## 2018-09-03 DIAGNOSIS — I10 Essential (primary) hypertension: Secondary | ICD-10-CM

## 2018-09-03 DIAGNOSIS — R739 Hyperglycemia, unspecified: Secondary | ICD-10-CM

## 2018-09-03 DIAGNOSIS — E782 Mixed hyperlipidemia: Secondary | ICD-10-CM

## 2018-09-03 HISTORY — DX: Pure hypercholesterolemia, unspecified: E78.00

## 2018-09-03 HISTORY — DX: Malignant neoplasm of bladder, unspecified: C67.9

## 2018-09-03 LAB — BASIC METABOLIC PANEL
Anion gap: 12 (ref 5–15)
BUN: 23 mg/dL (ref 8–23)
CO2: 24 mmol/L (ref 22–32)
Calcium: 9 mg/dL (ref 8.9–10.3)
Chloride: 98 mmol/L (ref 98–111)
Creatinine, Ser: 1.38 mg/dL — ABNORMAL HIGH (ref 0.61–1.24)
GFR calc Af Amer: 53 mL/min — ABNORMAL LOW (ref >60)
GFR calc non Af Amer: 45 mL/min — ABNORMAL LOW (ref >60)
Glucose, Bld: 269 mg/dL — ABNORMAL HIGH (ref 70–99)
Potassium: 3.3 mmol/L — ABNORMAL LOW (ref 3.5–5.1)
Sodium: 134 mmol/L — ABNORMAL LOW (ref 135–145)

## 2018-09-03 LAB — CBC
HCT: 39.9 % (ref 39.0–52.0)
Hemoglobin: 14.3 g/dL (ref 13.0–17.0)
MCH: 32.1 pg (ref 26.0–34.0)
MCHC: 35.8 g/dL (ref 30.0–36.0)
MCV: 89.7 fL (ref 80.0–100.0)
Platelets: 329 10*3/uL (ref 150–400)
RBC: 4.45 MIL/uL (ref 4.22–5.81)
RDW: 12.5 % (ref 11.5–15.5)
WBC: 11.4 10*3/uL — ABNORMAL HIGH (ref 4.0–10.5)
nRBC: 0 % (ref 0.0–0.2)

## 2018-09-03 LAB — SARS CORONAVIRUS 2 BY RT PCR (HOSPITAL ORDER, PERFORMED IN ~~LOC~~ HOSPITAL LAB): SARS Coronavirus 2: NEGATIVE

## 2018-09-03 LAB — TROPONIN I
Troponin I: <0.03 ng/mL (ref <0.03)
Troponin I: <0.03 ng/mL (ref <0.03)

## 2018-09-03 LAB — CBG MONITORING, ED: Glucose-Capillary: 277 mg/dL — ABNORMAL HIGH (ref 70–99)

## 2018-09-03 LAB — GLUCOSE, CAPILLARY: Glucose-Capillary: 178 mg/dL — ABNORMAL HIGH (ref 70–99)

## 2018-09-03 MED ORDER — ONDANSETRON HCL 4 MG/2ML IJ SOLN: 4.0000 mg | Freq: Four times a day (QID) | INTRAMUSCULAR | Status: DC | PRN

## 2018-09-03 MED ORDER — LORATADINE 10 MG PO TABS
10.0000 mg | ORAL_TABLET | Freq: Every day | ORAL | Status: DC
  Administered 2018-09-04: 10 mg via ORAL
  Filled 2018-09-03: qty 1

## 2018-09-03 MED ORDER — POLYETHYLENE GLYCOL 3350 17 G PO PACK: 17.0000 g | PACK | Freq: Every day | ORAL | Status: DC | PRN

## 2018-09-03 MED ORDER — AMLODIPINE BESYLATE 5 MG PO TABS
10.0000 mg | ORAL_TABLET | Freq: Every morning | ORAL | Status: DC
  Filled 2018-09-03: qty 2

## 2018-09-03 MED ORDER — GABAPENTIN 300 MG PO CAPS
300.0000 mg | ORAL_CAPSULE | Freq: Two times a day (BID) | ORAL | Status: DC
  Filled 2018-09-03 (×2): qty 1

## 2018-09-03 MED ORDER — ENOXAPARIN SODIUM 40 MG/0.4ML ~~LOC~~ SOLN
40.0000 mg | SUBCUTANEOUS | Status: DC
  Administered 2018-09-03: 22:00:00 40 mg via SUBCUTANEOUS
  Filled 2018-09-03: qty 0.4

## 2018-09-03 MED ORDER — LOSARTAN POTASSIUM 50 MG PO TABS
100.0000 mg | ORAL_TABLET | Freq: Every morning | ORAL | Status: DC
  Filled 2018-09-03: qty 2

## 2018-09-03 MED ORDER — MORPHINE SULFATE (PF) 2 MG/ML IV SOLN: 2.0000 mg | INTRAVENOUS | Status: DC | PRN

## 2018-09-03 MED ORDER — ONDANSETRON HCL 4 MG PO TABS: 4.0000 mg | ORAL_TABLET | Freq: Four times a day (QID) | ORAL | Status: DC | PRN

## 2018-09-03 MED ORDER — ACETAMINOPHEN 650 MG RE SUPP: 650.0000 mg | Freq: Four times a day (QID) | RECTAL | Status: DC | PRN

## 2018-09-03 MED ORDER — ACETAMINOPHEN 325 MG PO TABS
650.0000 mg | ORAL_TABLET | Freq: Four times a day (QID) | ORAL | Status: DC | PRN
  Administered 2018-09-04: 650 mg via ORAL
  Filled 2018-09-03: qty 2

## 2018-09-03 MED ORDER — INDAPAMIDE 2.5 MG PO TABS
2.5000 mg | ORAL_TABLET | Freq: Every morning | ORAL | Status: DC
  Filled 2018-09-03 (×2): qty 1

## 2018-09-03 MED ORDER — INSULIN GLARGINE 100 UNIT/ML ~~LOC~~ SOLN
10.0000 [IU] | Freq: Every day | SUBCUTANEOUS | Status: DC
  Administered 2018-09-03: 10 [IU] via SUBCUTANEOUS
  Filled 2018-09-03 (×2): qty 0.1

## 2018-09-03 MED ORDER — ASPIRIN 81 MG PO CHEW
324.0000 mg | CHEWABLE_TABLET | Freq: Once | ORAL | Status: AC
  Administered 2018-09-03: 324 mg via ORAL
  Filled 2018-09-03: qty 4

## 2018-09-03 MED ORDER — ASPIRIN EC 81 MG PO TBEC: 81.0000 mg | DELAYED_RELEASE_TABLET | Freq: Every day | ORAL | Status: DC

## 2018-09-03 MED ORDER — INSULIN ASPART 100 UNIT/ML ~~LOC~~ SOLN
0.0000 [IU] | Freq: Four times a day (QID) | SUBCUTANEOUS | Status: DC
  Administered 2018-09-04: 2 [IU] via SUBCUTANEOUS
  Administered 2018-09-04: 3 [IU] via SUBCUTANEOUS

## 2018-09-03 MED ORDER — LEVOTHYROXINE SODIUM 75 MCG PO TABS
75.0000 ug | ORAL_TABLET | Freq: Every day | ORAL | Status: DC
  Administered 2018-09-04: 06:00:00 75 ug via ORAL
  Filled 2018-09-03: qty 1

## 2018-09-03 MED ORDER — NITROGLYCERIN 0.4 MG SL SUBL
0.4000 mg | SUBLINGUAL_TABLET | SUBLINGUAL | Status: DC | PRN
  Administered 2018-09-03: 0.4 mg via SUBLINGUAL
  Filled 2018-09-03: qty 1

## 2018-09-03 MED ORDER — ATORVASTATIN CALCIUM 20 MG PO TABS
20.0000 mg | ORAL_TABLET | Freq: Every day | ORAL | Status: DC
  Filled 2018-09-03: qty 1

## 2018-09-03 NOTE — ED Notes (Signed)
Pt reports chest pain has resolved at this time , only feels pressure with deep breathing

## 2018-09-03 NOTE — ED Triage Notes (Signed)
Pt presents to ED with complaints of mid chest pain which started at 1300 today. Pt also complaining of pain between shoulder blades on back. Pt also having SOB, pt denies N/V

## 2018-09-03 NOTE — ED Notes (Signed)
ED TO INPATIENT HANDOFF REPORT  ED Nurse Name and Phone #: Osie Cheeks Name/Age/Gender Alexander Clayton 85 y.o. male Room/Bed: APA05/APA05  Code Status   Code Status: Not on file  Home/SNF/Other Home Patient oriented to: self, place, time and situation Is this baseline? Yes   Triage Complete: Triage complete  Chief Complaint Chest Pain  Triage Note Pt presents to ED with complaints of mid chest pain which started at 1300 today. Pt also complaining of pain between shoulder blades on back. Pt also having SOB, pt denies N/V   Allergies Allergies  Allergen Reactions  . Ciprofloxacin Hives and Rash  . Vioxx [Rofecoxib] Hives and Rash    sweating    Level of Care/Admitting Diagnosis ED Disposition    ED Disposition Condition Comment   Admit  The patient appears reasonably stabilized for admission considering the current resources, flow, and capabilities available in the ED at this time, and I doubt any other [facility] requiring further screening and/or treatment in the ED prior to admission is  present.       B Medical/Surgery History Past Medical History:  Diagnosis Date  . Bladder cancer ([place])   . Diabetes mellitus without complication ([place])   . Hypercholesteremia   . Hypertension    Past Surgical History:  Procedure Laterality Date  . APPENDECTOMY    . HERNIA REPAIR       A IV Location/Drains/Wounds Patient Lines/Drains/Airways Status   Active Line/Drains/Airways    Name:   Placement date:   Placement time:   Site:   Days:   Peripheral IV 09/03/18 Right   09/03/18    1516    -   less than 1          Intake/Output Last 24 hours No intake or output data in the 24 hours ending 09/03/18 1826  Labs/Imaging Results for orders placed or performed during the hospital encounter of 09/03/18 (from the past 48 hour(s))  Basic metabolic panel     Status: Abnormal   Collection Time: 09/03/18  3:17 PM  Result Value Ref Range   Sodium 134 (L) 135 - 145 mmol/L   Potassium 3.3 (L) 3.5 - 5.1 mmol/L   Chloride 98 98 - 111 mmol/L   CO2 24 22 - 32 mmol/L   Glucose, Bld 269 (H) 70 - 99 mg/dL   BUN 23 8 - 23 mg/dL   Creatinine, Ser 1.38 (H) 0.61 - 1.24 mg/dL   Calcium 9.0 8.9 - 10.3 mg/dL   GFR calc non Af Amer 45 (L) >60 mL/min   GFR calc Af Amer 53 (L) >60 mL/min   Anion gap 12 5 - 15    Comment: Performed at [facility], [address] [place], [place] [address]  Troponin I - Now Then [facility]     Status: None   Collection Time: 09/03/18  3:17 PM  Result Value Ref Range   Troponin I <0.03 <0.03 ng/mL    Comment: Performed at [facility], [address] [place] [employer] [place], [place] [address]  CBC     Status: Abnormal   Collection Time: 09/03/18  3:17 PM  Result Value Ref Range   WBC 11.4 (H) 4.0 - 10.5 K/uL   RBC 4.45 4.22 - 5.81 MIL/uL   Hemoglobin 14.3 13.0 - 17.0 g/dL   HCT 39.9 39.0 - 52.0 %   MCV 89.7 80.0 - 100.0 fL   MCH 32.1 26.0 - 34.0 pg   MCHC 35.8 30.0 - 36.0 g/dL  RDW 12.5 11.5 - 15.5 %   Platelets 329 150 - 400 K/uL   nRBC 0.0 0.0 - 0.2 %    Comment: Performed at [facility], [address] [place], [place] [address]  CBG monitoring, ED     Status: Abnormal   Collection Time: 09/03/18  3:27 PM  Result Value Ref Range   Glucose-Capillary 277 (H) 70 - 99 mg/dL   Dg Chest Portable 1 View  Result Date: 09/03/2018 CLINICAL DATA:  Mid chest pain beginning today with pain between shoulder blades and shortness of breath. EXAM: PORTABLE CHEST 1 VIEW COMPARISON:  04/08/2014 FINDINGS: Lungs are somewhat hypoinflated without focal airspace consolidation or effusion. Cardiomediastinal silhouette and remainder of the exam is unchanged. IMPRESSION: No active disease. Electronically Signed   By: Marin Olp M.D.   On: 09/03/2018 15:42    Pending Labs Unresulted Labs (From admission, onward)    Start     Ordered   09/03/18 1655  SARS Coronavirus 2 (CEPHEID - Performed in [place] hospital lab), Hosp Order  (Asymptomatic Patients Labs)  Once,   R     Question:  Rule Out  Answer:  Yes   09/03/18 1654   09/03/18 1509  Troponin I - Now Then Q3H  Now then every 3 hours,   STAT     09/03/18 1509          Vitals/Pain Today's Vitals   09/03/18 1600 09/03/18 1630 09/03/18 1700 09/03/18 1730  BP: (!) 141/63 128/61 137/73 (!) 128/58  Pulse: 63 (!) 56 (!) 59 (!) 52  Resp: 17 15 20 12   Temp:      TempSrc:      SpO2: 94% 95% 96% 93%  Weight:      Height:      PainSc:        Isolation Precautions No active isolations  Medications Medications  nitroGLYCERIN (NITROSTAT) SL tablet 0.4 mg (0.4 mg Sublingual Given 09/03/18 1528)  aspirin chewable tablet 324 mg (324 mg Oral Given 09/03/18 1527)    Mobility walks Low fall risk   Focused Assessments    R Recommendations: See Admitting Provider Note  Report given to:   Additional Notes:

## 2018-09-03 NOTE — H&P (Signed)
History and Physical    Alexander Clayton CLE:[id number] DOB: [birth year] DOA: 09/03/2018  PCP: Asencion Noble, MD   Patient coming from: Home  I have personally briefly reviewed patient's old medical records in [place]  Chief Complaint: Chest Pain  HPI: Alexander Clayton is a 85 y.o. male with medical history significant for diabetes mellitus, hypertension, bladder cancer, hypercholesterolemia, who presented to the ED with complaints of chest pain that started about 1 to 3 hours prior to arrival in the ED.  Chest pain is central, radiates to the back, described as pressure-like.  He was sitting/resting chest pain began.  Reports chest pain was worse when he took a deep breath, but he denied any difficulty breathing.  No nausea no vomiting, no palpitations.  Chest pain resolved with nitroglycerin given in the ED.  He denies prior history of heart attacks.  No lower extremity redness swelling or pain.  No personal history of blood clots in lungs or legs.  ED Course heart rate 40s to 70s, blood pressure systolic 944H to 675.  Troponin less than 0.03.  EKG with Q waves, without ST or T wave abnormalities, sinus rhythm.  Two-view chest x-ray, negative no acute abnormality.  Aspirin and nitro in the ED, improvement in chest pain after nitro was given.  Hospitalist to admit for chest pain rule out.  Review of Systems: As per HPI all other systems reviewed and negative.  Past Medical History:  Diagnosis Date  . Bladder cancer ([place])   . Diabetes mellitus without complication ([place])   . Hypercholesteremia   . Hypertension     Past Surgical History:  Procedure Laterality Date  . APPENDECTOMY    . HERNIA REPAIR       reports that he has never smoked. He has never used smokeless tobacco. He reports that he does not drink alcohol or use drugs.  Allergies  Allergen Reactions  . Ciprofloxacin   . Vioxx [Rofecoxib]    Family history of hypertension.  Prior to Admission medications    Medication Sig Start Date End Date Taking? Authorizing Provider  amLODipine (NORVASC) 10 MG tablet Take 10 mg by mouth daily.    Yes [provider]  atorvastatin (LIPITOR) 20 MG tablet Take 20 mg by mouth daily.   Yes [provider]  indapamide (LOZOL) 2.5 MG tablet Take 2.5 mg by mouth daily.    Yes [provider]  levothyroxine (SYNTHROID) 75 MCG tablet Take 75 mcg by mouth daily before breakfast.   Yes [provider]  metFORMIN (GLUCOPHAGE) 500 MG tablet Take 500 mg by mouth 2 (two) times daily.    Yes [provider]  acetaminophen (TYLENOL) 325 MG tablet Take 650 mg by mouth.    [provider]  aspirin EC 81 MG tablet Take 81 mg by mouth daily.     [provider]  cetirizine (ZYRTEC) 10 MG tablet Take 10 mg by mouth daily.    [provider]  glipiZIDE (GLUCOTROL) 5 MG tablet Take 5 mg by mouth.    [provider]  insulin glargine (LANTUS) 100 UNIT/ML injection Inject 18 Units into the skin.    [provider]  Multiple Vitamin (MULTIVITAMIN) capsule Take by mouth.    [provider]  Multiple Vitamins-Minerals (ICAPS AREDS 2 PO) Take by mouth.    [provider]  Probiotic CAPS Take by mouth.    [provider]    Physical Exam: Vitals:   09/03/18 1515  09/03/18 1530 09/03/18 1600 09/03/18 1630  BP:  136/67 (!) 141/63 128/61  Pulse: 79  63 (!) 56  Resp: (!) 22 14 17 15   Temp:      TempSrc:      SpO2: 96%  94% 95%  Weight:      Height:        Constitutional: NAD, calm, comfortable Vitals:   09/03/18 1515 09/03/18 1530 09/03/18 1600 09/03/18 1630  BP:  136/67 (!) 141/63 128/61  Pulse: 79  63 (!) 56  Resp: (!) 22 14 17 15   Temp:      TempSrc:      SpO2: 96%  94% 95%  Weight:      Height:       Eyes: PERRL, lids and conjunctivae normal ENMT: Mucous membranes are moist. Posterior pharynx clear of any exudate or lesions.Normal dentition.  Neck: normal,  supple, no masses, no thyromegaly Respiratory: clear to auscultation bilaterally, no wheezing, no crackles. Normal respiratory effort. No accessory muscle use.  Cardiovascular: Regular rate and rhythm, no murmurs / rubs / gallops. No extremity edema. 2+ pedal pulses. No carotid bruits.  Abdomen: no tenderness, no masses palpated. No hepatosplenomegaly. Bowel sounds positive.  Musculoskeletal: no clubbing / cyanosis. No joint deformity upper and lower extremities. Good ROM, no contractures. Normal muscle tone.  Skin: no rashes, lesions, ulcers. No induration Neurologic: CN 2-12 grossly intact.  Strength 5/5 in all 4.  Psychiatric: Normal judgment and insight. Alert and oriented x 3. Normal mood.   Labs on Admission: I have personally reviewed following labs and imaging studies  CBC: Recent Labs  Lab 09/03/18 1517  WBC 11.4*  HGB 14.3  HCT 39.9  MCV 89.7  PLT 496   Basic Metabolic Panel: Recent Labs  Lab 09/03/18 1517  NA 134*  K 3.3*  CL 98  CO2 24  GLUCOSE 269*  BUN 23  CREATININE 1.38*  CALCIUM 9.0   Cardiac Enzymes: Recent Labs  Lab 09/03/18 1517  TROPONINI <0.03   CBG: Recent Labs  Lab 09/03/18 1527  GLUCAP 277*    Radiological Exams on Admission: Dg Chest Portable 1 View  Result Date: 09/03/2018 CLINICAL DATA:  Mid chest pain beginning today with pain between shoulder blades and shortness of breath. EXAM: PORTABLE CHEST 1 VIEW COMPARISON:  04/08/2014 FINDINGS: Lungs are somewhat hypoinflated without focal airspace consolidation or effusion. Cardiomediastinal silhouette and remainder of the exam is unchanged. IMPRESSION: No active disease. Electronically Signed   By: Marin Olp M.D.   On: 09/03/2018 15:42    EKG: Independently reviewed.  Sinus rhythm, Q waves 2 3 aVF, QTC 462.  T wave changes in lead I only, otherwise EKG unchanged from prior.  Assessment/Plan Active Problems:   Chest pain   Chest pain-with both typical and atypical features, improved  with nitroglycerin.  Patient high risk with hypertension diabetes, advanced age, hypercholesterolemia.  No dyspnea, no tachycardia-PE unlikely. Nuclear Stress test 07/2015-was suboptimal, but EF56%, but read as low risk study.  Aspirin 325 given in ED.  - Trend Trop - Cont Aspirin 81mg  daily, Resume Atorv 20mg  daily - EKG a.m - IV morphine 2mg  PRN - NPO midnight- Cardiology consulted  Hypertension-stable. - Continue home norvasc, Losartan, indapamide  Diabetes- Glucose random 269. Home meds Lantus 35u, metformin and glipizide. - SSI for now - Hold glipizide and meformin for now - Cont home lantus at reduced dose 10u daily - HGbA1c  Hypercholesterolemia - Cont home Atorvastatin  DVT prophylaxis: Lovenox Code Status: Full  Family Communication: None at bedside Disposition Plan: 1-2 days Consults called: Cardiology Admission status: Obs, tele   Bethena Roys MD [facility]  09/03/2018, 7:19 PM

## 2018-09-03 NOTE — ED Notes (Signed)
Pt sitting on side of bed talking on phone

## 2018-09-03 NOTE — ED Provider Notes (Signed)
[facility] EMERGENCY DEPARTMENT Provider Note   CSN: [id number] Arrival date & time: 09/03/18  1450    History   Chief Complaint Chief Complaint  Patient presents with  . Chest Pain    HPI SAEL FURCHES is a 85 y.o. male.  HPI: A 85 year old patient with a history of treated diabetes, hypertension and hypercholesterolemia presents for evaluation of chest pain. Initial onset of pain was approximately 1-3 hours ago. The patient's chest pain is not worse with exertion. The patient's chest pain is middle- or left-sided, is not well-localized, is not described as heaviness/pressure/tightness, is not sharp and does not radiate to the arms/jaw/neck. The patient does not complain of nausea and denies diaphoresis. The patient has no history of stroke, has no history of peripheral artery disease, has not smoked in the past 90 days, has no relevant family history of coronary artery disease (first degree relative at less than age 47) and does not have an elevated BMI (>=30).   Pt also notices that it hurts with deep breathing.  He has also been burping more than normal.  No history of CAD.  Prior routine cardiology eval was normal per patient.  HPI  Past Medical History:  Diagnosis Date  . Bladder cancer ([place])   . Diabetes mellitus without complication ([place])   . Hypercholesteremia   . Hypertension     There are no active problems to display for this patient.   Past Surgical History:  Procedure Laterality Date  . APPENDECTOMY    . HERNIA REPAIR          Home Medications    Prior to Admission medications   Medication Sig Start Date End Date Taking? Authorizing Provider  amLODipine (NORVASC) 10 MG tablet Take 10 mg by mouth daily.    Yes [provider]  atorvastatin (LIPITOR) 20 MG tablet Take 20 mg by mouth daily.   Yes [provider]  indapamide (LOZOL) 2.5 MG tablet Take 2.5 mg by mouth daily.    Yes [provider]  levothyroxine (SYNTHROID)  75 MCG tablet Take 75 mcg by mouth daily before breakfast.   Yes [provider]  metFORMIN (GLUCOPHAGE) 500 MG tablet Take 500 mg by mouth 2 (two) times daily.    Yes [provider]  acetaminophen (TYLENOL) 325 MG tablet Take 650 mg by mouth.    [provider]  aspirin EC 81 MG tablet Take 81 mg by mouth daily.     [provider]  cetirizine (ZYRTEC) 10 MG tablet Take 10 mg by mouth daily.    [provider]  glipiZIDE (GLUCOTROL) 5 MG tablet Take 5 mg by mouth.    [provider]  insulin glargine (LANTUS) 100 UNIT/ML injection Inject 18 Units into the skin.    [provider]  Multiple Vitamin (MULTIVITAMIN) capsule Take by mouth.    [provider]  Multiple Vitamins-Minerals (ICAPS AREDS 2 PO) Take by mouth.    [provider]  Probiotic CAPS Take by mouth.    [provider]    Family History No family history on file.  Social History Social History   Tobacco Use  . Smoking status: Never Smoker  . Smokeless tobacco: Never Used  Substance Use Topics  . Alcohol use: No    Alcohol/week: 0.0 standard drinks  . Drug use: No     Allergies   Ciprofloxacin and Vioxx [rofecoxib]   Review of Systems Review of Systems  All other systems reviewed  and are negative.    Physical Exam Updated Vital Signs BP 128/61   Pulse (!) 56   Temp 97.8 F (36.6 C) (Oral)   Resp 15   Ht 1.727 m (5\' 8" )   Wt 90.7 kg   SpO2 95%   BMI 30.41 kg/m   Physical Exam Vitals signs and nursing note reviewed.  Constitutional:      General: He is not in acute distress.    Appearance: He is well-developed.  HENT:     Head: Normocephalic and atraumatic.     Right Ear: External ear normal.     Left Ear: External ear normal.  Eyes:     General: No scleral icterus.       Right eye: No discharge.        Left eye: No discharge.     Conjunctiva/sclera: Conjunctivae normal.  Neck:     Musculoskeletal:  Neck supple.     Trachea: No tracheal deviation.  Cardiovascular:     Rate and Rhythm: Normal rate and regular rhythm.  Pulmonary:     Effort: Pulmonary effort is normal. No respiratory distress.     Breath sounds: Normal breath sounds. No stridor. No wheezing or rales.  Chest:     Chest wall: No deformity or tenderness.  Abdominal:     General: Bowel sounds are normal. There is no distension.     Palpations: Abdomen is soft.     Tenderness: There is no abdominal tenderness. There is no guarding or rebound.  Musculoskeletal:        General: No tenderness.     Right lower leg: He exhibits no tenderness. No edema.     Left lower leg: He exhibits no tenderness. No edema.  Skin:    General: Skin is warm and dry.     Findings: No rash.  Neurological:     Mental Status: He is alert.     Cranial Nerves: No cranial nerve deficit (no facial droop, extraocular movements intact, no slurred speech).     Sensory: No sensory deficit.     Motor: No abnormal muscle tone or seizure activity.     Coordination: Coordination normal.      ED Treatments / Results  Labs (all labs ordered are listed, but only abnormal results are displayed) Labs Reviewed  BASIC METABOLIC PANEL - Abnormal; Notable for the following components:      Result Value   Sodium 134 (*)    Potassium 3.3 (*)    Glucose, Bld 269 (*)    Creatinine, Ser 1.38 (*)    GFR calc non Af Amer 45 (*)    GFR calc Af Amer 53 (*)    All other components within normal limits  CBC - Abnormal; Notable for the following components:   WBC 11.4 (*)    All other components within normal limits  CBG MONITORING, ED - Abnormal; Notable for the following components:   Glucose-Capillary 277 (*)    All other components within normal limits  SARS CORONAVIRUS 2 (HOSPITAL ORDER, [place] [facility])  TROPONIN I  TROPONIN I    EKG EKG Interpretation  Date/Time:  Monday Sep 03 2018 14:58:33 EDT Ventricular Rate:  91 PR  Interval:    QRS Duration: 107 QT Interval:  375 QTC Calculation: 462 R Axis:   86 Text Interpretation:  Sinus rhythm Borderline right axis deviation Borderline repolarization abnormality Baseline wander in lead(s) V3 No significant change since last tracing Confirmed by Tomi Bamberger,  Wille Glaser [phone]) on 09/03/2018 3:06:38 PM   Radiology Dg Chest Portable 1 View  Result Date: 09/03/2018 CLINICAL DATA:  Mid chest pain beginning today with pain between shoulder blades and shortness of breath. EXAM: PORTABLE CHEST 1 VIEW COMPARISON:  04/08/2014 FINDINGS: Lungs are somewhat hypoinflated without focal airspace consolidation or effusion. Cardiomediastinal silhouette and remainder of the exam is unchanged. IMPRESSION: No active disease. Electronically Signed   By: Marin Olp M.D.   On: 09/03/2018 15:42    Procedures Procedures (including critical care time)  Medications Ordered in ED Medications  nitroGLYCERIN (NITROSTAT) SL tablet 0.4 mg (0.4 mg Sublingual Given 09/03/18 1528)  aspirin chewable tablet 324 mg (324 mg Oral Given 09/03/18 1527)     Initial Impression / Assessment and Plan / ED Course  I have reviewed the triage vital signs and the nursing notes.  Pertinent labs & imaging results that were available during my care of the patient were reviewed by me and considered in my medical decision making (see chart for details).  Clinical Course as of Sep 03 1654  Mon Sep 03, 2018  1644 April 2017, submaximum negative stress test.   [JK]  1655 Patient states he is pain-free.  The aspirin and nitroglycerin did not immediately take away his discomfort but it has completely resolved at this point.   [JK]    Clinical Course User Index [JK] Dorie Rank, MD    HEAR Score: 5  Patient presented to the emergency room for evaluation of chest pain.  Some atypical features considering the recent burping and belching.  However, patient does have cardiac risk factors and is at moderate risk per heart/hear  score.  Considering his risk and comorbidities it is reasonable to bring him in for serial cardiac enzymes and rule out.  I will consult the medical service.  Final Clinical Impressions(s) / ED Diagnoses   Final diagnoses:  Chest pain, unspecified type  Hyperglycemia      Dorie Rank, MD 09/03/18 1657

## 2018-09-04 ENCOUNTER — Observation Stay (HOSPITAL_COMMUNITY): Payer: Medicare Other

## 2018-09-04 ENCOUNTER — Telehealth: Payer: Self-pay | Admitting: *Deleted

## 2018-09-04 ENCOUNTER — Observation Stay (HOSPITAL_BASED_OUTPATIENT_CLINIC_OR_DEPARTMENT_OTHER): Payer: Medicare Other

## 2018-09-04 ENCOUNTER — Encounter (HOSPITAL_COMMUNITY): Payer: Self-pay | Admitting: Student

## 2018-09-04 ENCOUNTER — Encounter: Payer: Self-pay | Admitting: *Deleted

## 2018-09-04 DIAGNOSIS — R079 Chest pain, unspecified: Secondary | ICD-10-CM

## 2018-09-04 DIAGNOSIS — Z794 Long term (current) use of insulin: Secondary | ICD-10-CM

## 2018-09-04 DIAGNOSIS — N183 Chronic kidney disease, stage 3 unspecified: Secondary | ICD-10-CM

## 2018-09-04 DIAGNOSIS — E119 Type 2 diabetes mellitus without complications: Secondary | ICD-10-CM | POA: Diagnosis not present

## 2018-09-04 DIAGNOSIS — E782 Mixed hyperlipidemia: Secondary | ICD-10-CM | POA: Diagnosis not present

## 2018-09-04 DIAGNOSIS — I10 Essential (primary) hypertension: Secondary | ICD-10-CM

## 2018-09-04 DIAGNOSIS — R0602 Shortness of breath: Secondary | ICD-10-CM | POA: Diagnosis not present

## 2018-09-04 LAB — BASIC METABOLIC PANEL
Anion gap: 10 (ref 5–15)
BUN: 18 mg/dL (ref 8–23)
CO2: 26 mmol/L (ref 22–32)
Calcium: 9 mg/dL (ref 8.9–10.3)
Chloride: 101 mmol/L (ref 98–111)
Creatinine, Ser: 1.03 mg/dL (ref 0.61–1.24)
GFR calc Af Amer: >60 mL/min (ref >60)
GFR calc non Af Amer: >60 mL/min (ref >60)
Glucose, Bld: 150 mg/dL — ABNORMAL HIGH (ref 70–99)
Potassium: 3.4 mmol/L — ABNORMAL LOW (ref 3.5–5.1)
Sodium: 137 mmol/L (ref 135–145)

## 2018-09-04 LAB — GLUCOSE, CAPILLARY
Glucose-Capillary: 128 mg/dL — ABNORMAL HIGH (ref 70–99)
Glucose-Capillary: 148 mg/dL — ABNORMAL HIGH (ref 70–99)
Glucose-Capillary: 154 mg/dL — ABNORMAL HIGH (ref 70–99)

## 2018-09-04 LAB — HEMOGLOBIN A1C
Hgb A1c MFr Bld: 8.2 % — ABNORMAL HIGH (ref 4.8–5.6)
Mean Plasma Glucose: 188.64 mg/dL

## 2018-09-04 LAB — ECHOCARDIOGRAM COMPLETE
Height: 67 in
Weight: 3281.6 oz

## 2018-09-04 LAB — TROPONIN I
Troponin I: <0.03 ng/mL (ref <0.03)
Troponin I: <0.03 ng/mL (ref <0.03)

## 2018-09-04 LAB — D-DIMER, QUANTITATIVE: D-Dimer, Quant: 0.9 ug/mL-FEU — ABNORMAL HIGH (ref 0.00–0.50)

## 2018-09-04 MED ORDER — IOHEXOL 350 MG/ML SOLN
100.0000 mL | Freq: Once | INTRAVENOUS | Status: AC | PRN
  Administered 2018-09-04: 100 mL via INTRAVENOUS

## 2018-09-04 MED ORDER — METOPROLOL TARTRATE 50 MG PO TABS: 50.0000 mg | ORAL_TABLET | Freq: Once | ORAL | 0 refills | Status: DC

## 2018-09-04 NOTE — Plan of Care (Signed)
  Problem: Education: Goal: Knowledge of General Education information will improve Description: Including pain rating scale, medication(s)/side effects and non-pharmacologic comfort measures Outcome: Adequate for Discharge   Problem: Health Behavior/Discharge Planning: Goal: Ability to manage health-related needs will improve Outcome: Adequate for Discharge   Problem: Clinical Measurements: Goal: Ability to maintain clinical measurements within normal limits will improve Outcome: Adequate for Discharge   Problem: Activity: Goal: Risk for activity intolerance will decrease Outcome: Adequate for Discharge   Problem: Nutrition: Goal: Adequate nutrition will be maintained Outcome: Adequate for Discharge   Problem: Coping: Goal: Level of anxiety will decrease Outcome: Adequate for Discharge   Problem: Elimination: Goal: Will not experience complications related to bowel motility Outcome: Adequate for Discharge   Problem: Pain Managment: Goal: General experience of comfort will improve Outcome: Adequate for Discharge   

## 2018-09-04 NOTE — Progress Notes (Signed)
*  PRELIMINARY RESULTS* Echocardiogram 2D Echocardiogram has been performed.  Samuel Germany 09/04/2018, 9:45 AM

## 2018-09-04 NOTE — Discharge Summary (Signed)
Physician Discharge Summary  Alexander Clayton BJY:[id number] DOB: Oct 01, 1927 DOA: 09/03/2018  PCP: Asencion Noble, MD  Admit date: 09/03/2018 Discharge date: 09/04/2018  Admitted From: Home Disposition:  Home   Recommendations for Outpatient Follow-up:  1. Follow up with PCP in 1-2 weeks 2. Please obtain BMP/CBC in one week   Discharge Condition: Stable CODE STATUS: FULL Diet recommendation: Heart Healthy   Brief/Interim Summary: 85 year old male with a history of diabetes mellitus, hypertension, hyperlipidemia, transitional cell carcinoma of the bladder presenting with chest discomfort starting around 1 PM on 09/03/2018.  The patient describes it as a pressure-like sensation in the central area of his chest radiating into his back.  He stated it lasted approximately 2 hours.  He denied any diaphoresis, dizziness, shortness of breath, nausea.  He denied fever, chills, coughing, hemoptysis, nausea, vomiting, diarrhea.  It was somewhat pleuritic with some worsening with deep breath.  He states that he has been walking 1/2 mile per day without any chest discomfort or worsening shortness of breath. He presented to the emergency department for further evaluation.  He was afebrile hemodynamically stable saturating 97% on room air.  BMP showed a serum potassium of 3.3 with serum creatinine 1.3.  WBC was 7.4.  EKG shows sinus rhythm with nonspecific T wave changes.  Chest x-ray was negative. He was seen by cardiology who cleared the patient for discharge.  He will be set up with outpatient CT angiography  Discharge Diagnoses:  Atypical chest pain -Troponins negative x3 -Cardiology consult appreciated-->stable for d/c with outpt coronary CT angiography -Echocardiogram--EF 60-65%, no WMA -Continue aspirin -09/04/18-CTA chest--no PE--Probable granulomas in the right upper lobe, largest measuring slightly greater than 2 cm in maximum transverse diameter  Pulmonary nodules/granulomas -outpt  surveillance and follow up  CKD stage 3 -baseline creatinine 1.0-1.3  Diabetes mellitus type 2 -Check hemoglobin A1c--8.2 -Continue reduced dose Lantus during the hospitalization -NovoLog sliding scale  Essential hypertension -Continue amlodipine, Lozol, losartan  Hyperlipidemia -Continue statin  Hypothyroidism -Continue Synthroid   Discharge Instructions   Allergies as of 09/04/2018      Reactions   Ciprofloxacin Hives, Rash   Vioxx [rofecoxib] Hives, Rash   sweating      Medication List    TAKE these medications   acetaminophen 500 MG tablet Commonly known as:  TYLENOL Take 1,000 mg by mouth 3 (three) times daily.   amLODipine 10 MG tablet Commonly known as:  NORVASC Take 10 mg by mouth every morning.   aspirin EC 81 MG tablet Take 81 mg by mouth at bedtime.   atorvastatin 20 MG tablet Commonly known as:  LIPITOR Take 20 mg by mouth at bedtime.   cetirizine 10 MG tablet Commonly known as:  ZYRTEC Take 10 mg by mouth at bedtime.   gabapentin 300 MG capsule Commonly known as:  NEURONTIN Take 300 mg by mouth 2 (two) times a day.   glipiZIDE 5 MG tablet Commonly known as:  GLUCOTROL Take 5 mg by mouth every morning.   ICAPS AREDS 2 PO Take 1 tablet by mouth 2 (two) times a day.   indapamide 2.5 MG tablet Commonly known as:  LOZOL Take 2.5 mg by mouth every morning.   Lantus 100 UNIT/ML injection Generic drug:  insulin glargine Inject 35 Units into the skin at bedtime.   levothyroxine 75 MCG tablet Commonly known as:  SYNTHROID Take 75 mcg by mouth daily before breakfast.   losartan 100 MG tablet Commonly known as:  COZAAR Take 100 mg by mouth  every morning.   metFORMIN 500 MG tablet Commonly known as:  GLUCOPHAGE Take 500 mg by mouth 2 (two) times daily.   multivitamin capsule Take 1 capsule by mouth at bedtime.      Follow-up Information    Erma Heritage, PA-C Follow up on 10/18/2018.   Specialties:  Physician  Assistant, Cardiology Why:  The office will contact you in regards to arranging your Coronary CT. Will follow-up with Cardiology on 10/18/2018 at 2:30 to review results.  Contact information: [address] [place] [address] [phone]          Allergies  Allergen Reactions  . Ciprofloxacin Hives and Rash  . Vioxx [Rofecoxib] Hives and Rash    sweating    Consultations:  cardiology   Procedures/Studies: Dg Chest Portable 1 View  Result Date: 09/03/2018 CLINICAL DATA:  Mid chest pain beginning today with pain between shoulder blades and shortness of breath. EXAM: PORTABLE CHEST 1 VIEW COMPARISON:  04/08/2014 FINDINGS: Lungs are somewhat hypoinflated without focal airspace consolidation or effusion. Cardiomediastinal silhouette and remainder of the exam is unchanged. IMPRESSION: No active disease. Electronically Signed   By: Marin Olp M.D.   On: 09/03/2018 15:42         Discharge Exam: Vitals:   09/04/18 0608 09/04/18 0808  BP: (!) 141/70   Pulse: 63   Resp: 20   Temp: 98.2 F (36.8 C)   SpO2: 97% 97%   Vitals:   09/03/18 1930 09/03/18 2049 09/04/18 0608 09/04/18 0808  BP: 136/68 (!) 164/63 (!) 141/70   Pulse: (!) 58 67 63   Resp: 13 20 20    Temp:  98 F (36.7 C) 98.2 F (36.8 C)   TempSrc:  Oral Oral   SpO2: 98% 97% 97% 97%  Weight:  93 kg    Height:  5\' 7"  (1.702 m)      General: Alexander Clayton is alert, awake, not in acute distress Cardiovascular: RRR, S1/S2 +, no rubs, no gallops Respiratory: CTA bilaterally, no wheezing, no rhonchi Abdominal: Soft, NT, ND, bowel sounds + Extremities: no edema, no cyanosis   The results of significant diagnostics from this hospitalization (including imaging, microbiology, ancillary and laboratory) are listed below for reference.    Significant Diagnostic Studies: Dg Chest Portable 1 View  Result Date: 09/03/2018 CLINICAL DATA:  Mid chest pain beginning today with pain between shoulder blades and shortness of breath.  EXAM: PORTABLE CHEST 1 VIEW COMPARISON:  04/08/2014 FINDINGS: Lungs are somewhat hypoinflated without focal airspace consolidation or effusion. Cardiomediastinal silhouette and remainder of the exam is unchanged. IMPRESSION: No active disease. Electronically Signed   By: Marin Olp M.D.   On: 09/03/2018 15:42     Microbiology: Recent Results (from the past 240 hour(s))  SARS Coronavirus 2 (CEPHEID - Performed in [place] hospital lab), Hosp Order     Status: None   Collection Time: 09/03/18  6:00 PM  Result Value Ref Range Status   SARS Coronavirus 2 NEGATIVE NEGATIVE Final    Comment: (NOTE) If result is NEGATIVE SARS-CoV-2 target nucleic acids are NOT DETECTED. The SARS-CoV-2 RNA is generally detectable in upper and lower  respiratory specimens during the acute phase of infection. The lowest  concentration of SARS-CoV-2 viral copies this assay can detect is 250  copies / mL. A negative result does not preclude SARS-CoV-2 infection  and should not be used as the sole basis for treatment or other  patient management decisions.  A negative result may occur with  improper specimen collection / handling, submission of specimen other  than nasopharyngeal swab, presence of viral mutation(s) within the  areas targeted by this assay, and inadequate number of viral copies  (<250 copies / mL). A negative result must be combined with clinical  observations, patient history, and epidemiological information. If result is POSITIVE SARS-CoV-2 target nucleic acids are DETECTED. The SARS-CoV-2 RNA is generally detectable in upper and lower  respiratory specimens dur ing the acute phase of infection.  Positive  results are indicative of active infection with SARS-CoV-2.  Clinical  correlation with patient history and other diagnostic information is  necessary to determine patient infection status.  Positive results do  not rule out bacterial infection or co-infection with other viruses. If  result is PRESUMPTIVE POSTIVE SARS-CoV-2 nucleic acids MAY BE PRESENT.   A presumptive positive result was obtained on the submitted specimen  and confirmed on repeat testing.  While 2019 novel coronavirus  (SARS-CoV-2) nucleic acids may be present in the submitted sample  additional confirmatory testing may be necessary for epidemiological  and / or clinical management purposes  to differentiate between  SARS-CoV-2 and other Sarbecovirus currently known to infect humans.  If clinically indicated additional testing with an alternate test  methodology [phone]) is advised. The SARS-CoV-2 RNA is generally  detectable in upper and lower respiratory sp ecimens during the acute  phase of infection. The expected result is Negative. Fact Sheet for Patients:  StrictlyIdeas.no Fact Sheet for Healthcare Providers: BankingDealers.co.za This test is not yet approved or cleared by the [place] FDA and has been authorized for detection and/or diagnosis of SARS-CoV-2 by FDA under an Emergency Use Authorization (EUA).  This EUA will remain in effect (meaning this test can be used) for the duration of the COVID-19 declaration under Section 564(b)(1) of the Act, 21 U.S.C. section 360bbb-3(b)(1), unless the authorization is terminated or revoked sooner. Performed at [facility], [address], [place], [place] [address]      Labs: Basic Metabolic Panel: Recent Labs  Lab 09/03/18 1517 09/04/18 0845  NA 134* 137  K 3.3* 3.4*  CL 98 101  CO2 24 26  GLUCOSE 269* 150*  BUN 23 18  CREATININE 1.38* 1.03  CALCIUM 9.0 9.0   Liver Function Tests: No results for input(s): AST, ALT, ALKPHOS, BILITOT, PROT, ALBUMIN in the last 168 hours. No results for input(s): LIPASE, AMYLASE in the last 168 hours. No results for input(s): AMMONIA in the last 168 hours. CBC: Recent Labs  Lab 09/03/18 1517  WBC 11.4*  HGB 14.3  HCT 39.9  MCV 89.7  PLT 329    Cardiac Enzymes: Recent Labs  Lab 09/03/18 1517 09/03/18 1803 09/03/18 2342 09/04/18 0535  TROPONINI <0.03 <0.03 <0.03 <0.03   BNP: Invalid input(s): POCBNP CBG: Recent Labs  Lab 09/03/18 1527 09/03/18 2052 09/04/18 0553  GLUCAP 277* 178* 128*    Time coordinating discharge:  36 minutes  Signed:  Orson Eva, DO [facility] Pager: [phone] 09/04/2018, 11:32 AM

## 2018-09-04 NOTE — Consult Note (Addendum)
Cardiology Consult    Patient ID: Alexander Clayton; [id number]; 1928-02-09   Admit date: 09/03/2018 Date of Consult: 09/04/2018  Primary Care Provider: Asencion Noble, MD Primary Cardiologist: Dorris Carnes, MD (last evaluated in 2017)  Patient Profile    Alexander Clayton is a 85 y.o. male with past medical history of HTN, HLD, Type 2 DM, and bladder cancer (s/p immunotherapy) who is being seen today for the evaluation of chest pain at the request of Dr. Denton Brick.   History of Present Illness    Alexander Clayton was last examined by Dr. Harrington Challenger in 07/2015 and reported having chest discomfort while at rest but denied any symptoms with walking 2 miles per day. A repeat NST was performed and showed no significant ischemia (although stress portion was submaximal) and was a low-risk study.   He presented to [facility] ED on 09/03/2018 for evaluation of chest pain which started earlier that day and lasted for approximately 2 hours. He reported the discomfort radiated into his back but was worse with deep breathing. Was not exacerbated with exertion. Denied any associated nausea, vomiting, or diaphoresis. No recent fever, chills, or sick contacts.   Initial labs show WBC 11.4, Hgb 14.3, platelets 329, Na+ 134, K+ 3.3, and creatinine 1.38. Initial and cyclic troponin values have been negative. COVID-19 testing negative. CXR shows no active cardiopulmonary disease. EKG shows NSR, HR 91, with LVH and slight ST depression along lateral leads, most consistent with repol abnormality.    Past Medical History:  Diagnosis Date  . Bladder cancer ([place])   . Diabetes mellitus without complication ([place])   . Hypercholesteremia   . Hypertension     Past Surgical History:  Procedure Laterality Date  . APPENDECTOMY    . HERNIA REPAIR       Home Medications:  Prior to Admission medications   Medication Sig Start Date End Date Taking? Authorizing Provider  acetaminophen (TYLENOL) 500 MG tablet Take 1,000 mg by  mouth 3 (three) times daily.    Yes [provider]  amLODipine (NORVASC) 10 MG tablet Take 10 mg by mouth every morning.    Yes [provider]  aspirin EC 81 MG tablet Take 81 mg by mouth at bedtime.    Yes [provider]  atorvastatin (LIPITOR) 20 MG tablet Take 20 mg by mouth at bedtime.    Yes [provider]  cetirizine (ZYRTEC) 10 MG tablet Take 10 mg by mouth at bedtime.    Yes [provider]  gabapentin (NEURONTIN) 300 MG capsule Take 300 mg by mouth 2 (two) times a day.   Yes [provider]  glipiZIDE (GLUCOTROL) 5 MG tablet Take 5 mg by mouth every morning.    Yes [provider]  indapamide (LOZOL) 2.5 MG tablet Take 2.5 mg by mouth every morning.    Yes [provider]  insulin glargine (LANTUS) 100 UNIT/ML injection Inject 35 Units into the skin at bedtime.    Yes [provider]  levothyroxine (SYNTHROID) 75 MCG tablet Take 75 mcg by mouth daily before breakfast.   Yes [provider]  losartan (COZAAR) 100 MG tablet Take 100 mg by mouth every morning.   Yes [provider]  metFORMIN (GLUCOPHAGE) 500 MG tablet Take 500 mg by mouth 2 (two) times daily.    Yes [provider]  Multiple Vitamin (MULTIVITAMIN) capsule Take 1 capsule by mouth at bedtime.    Yes [provider]  Multiple Vitamins-Minerals (ICAPS  AREDS 2 PO) Take 1 tablet by mouth 2 (two) times a day.    Yes [provider]    Inpatient Medications: Scheduled Meds: . amLODipine  10 mg Oral q morning - 10a  . aspirin EC  81 mg Oral QHS  . atorvastatin  20 mg Oral QHS  . enoxaparin (LOVENOX) injection  40 mg Subcutaneous Q24H  . gabapentin  300 mg Oral BID  . indapamide  2.5 mg Oral q morning - 10a  . insulin aspart  0-15 Units Subcutaneous Q6H  . insulin glargine  10 Units Subcutaneous QHS  . levothyroxine  75 mcg Oral Q0600  . loratadine  10 mg Oral Daily  . losartan  100 mg Oral q  morning - 10a   Continuous Infusions:  PRN Meds: acetaminophen **OR** acetaminophen, morphine injection, nitroGLYCERIN, ondansetron **OR** ondansetron (ZOFRAN) IV, polyethylene glycol  Allergies:    Allergies  Allergen Reactions  . Ciprofloxacin Hives and Rash  . Vioxx [Rofecoxib] Hives and Rash    sweating    Social History:   Social History   Socioeconomic History  . Marital status: Married    Spouse name: Not on file  . Number of children: Not on file  . Years of education: Not on file  . Highest education level: Not on file  Occupational History  . Not on file  Social Needs  . Financial resource strain: Not on file  . Food insecurity:    Worry: Not on file    Inability: Not on file  . Transportation needs:    Medical: Not on file    Non-medical: Not on file  Tobacco Use  . Smoking status: Never Smoker  . Smokeless tobacco: Never Used  Substance and Sexual Activity  . Alcohol use: No    Alcohol/week: 0.0 standard drinks  . Drug use: No  . Sexual activity: Not on file  Lifestyle  . Physical activity:    Days per week: Not on file    Minutes per session: Not on file  . Stress: Not on file  Relationships  . Social connections:    Talks on phone: Not on file    Gets together: Not on file    Attends religious service: Not on file    Active member of club or organization: Not on file    Attends meetings of clubs or organizations: Not on file    Relationship status: Not on file  . Intimate partner violence:    Fear of current or ex partner: Not on file    Emotionally abused: Not on file    Physically abused: Not on file    Forced sexual activity: Not on file  Other Topics Concern  . Not on file  Social History Narrative  . Not on file     Family History:    Family History  Problem Relation Age of Onset  . Hypertension Father       Review of Systems    General:  No chills, fever, night sweats or weight changes.  Cardiovascular:  No dyspnea on  exertion, edema, orthopnea, palpitations, paroxysmal nocturnal dyspnea. Positive for chest pain.  Dermatological: No rash, lesions/masses Respiratory: No cough, dyspnea Urologic: No hematuria, dysuria Abdominal:   No nausea, vomiting, diarrhea, bright red blood per rectum, melena, or hematemesis Neurologic:  No visual changes, wkns, changes in mental status. All other systems reviewed and are otherwise negative except as noted above.  Physical Exam/Data    Vitals:   09/03/18 1830  09/03/18 1930 09/03/18 2049 09/04/18 0608  BP: (!) 143/73 136/68 (!) 164/63 (!) 141/70  Pulse: (!) 49 (!) 58 67 63  Resp: 13 13 20 20   Temp:   98 F (36.7 C) 98.2 F (36.8 C)  TempSrc:   Oral Oral  SpO2: 96% 98% 97% 97%  Weight:   93 kg   Height:   5\' 7"  (1.702 m)     Intake/Output Summary (Last 24 hours) at 09/04/2018 0746 Last data filed at 09/03/2018 2100 Gross per 24 hour  Intake 240 ml  Output -  Net 240 ml   Filed Weights   09/03/18 1457 09/03/18 2049  Weight: 90.7 kg 93 kg   Body mass index is 32.12 kg/m.   General: Pleasant, male appearing in NAD Psych: Normal affect. Neuro: Alert and oriented X 3. Moves all extremities spontaneously. HEENT: Normal  Neck: Supple without bruits or JVD. Lungs:  Resp regular and unlabored, CTA without wheezing or rales. Heart: RRR no s3, s4, or murmurs. Abdomen: Soft, non-tender, non-distended, BS + x 4.  Extremities: No clubbing, cyanosis or edema. DP/PT/Radials 2+ and equal bilaterally.   Labs/Studies     Relevant CV Studies:  NST: 07/2015  There was no ST segment deviation noted during stress.  This is a low risk study based on myocardial perfusion.  Nuclear stress EF: 56%.  Submaximal stress test, limiting the ability of this study to detect ischemia. Consider pharmacologic stress if clinically indicated.  Laboratory Data:  Chemistry Recent Labs  Lab 09/03/18 1517  NA 134*  K 3.3*  CL 98  CO2 24  GLUCOSE 269*  BUN 23   CREATININE 1.38*  CALCIUM 9.0  GFRNONAA 45*  GFRAA 53*  ANIONGAP 12    No results for input(s): PROT, ALBUMIN, AST, ALT, ALKPHOS, BILITOT in the last 168 hours. Hematology Recent Labs  Lab 09/03/18 1517  WBC 11.4*  RBC 4.45  HGB 14.3  HCT 39.9  MCV 89.7  MCH 32.1  MCHC 35.8  RDW 12.5  PLT 329   Cardiac Enzymes Recent Labs  Lab 09/03/18 1517 09/03/18 1803 09/03/18 2342 09/04/18 0535  TROPONINI <0.03 <0.03 <0.03 <0.03   No results for input(s): TROPIPOC in the last 168 hours.  BNPNo results for input(s): BNP, PROBNP in the last 168 hours.  DDimer No results for input(s): DDIMER in the last 168 hours.  Radiology/Studies:  Dg Chest Portable 1 View  Result Date: 09/03/2018 CLINICAL DATA:  Mid chest pain beginning today with pain between shoulder blades and shortness of breath. EXAM: PORTABLE CHEST 1 VIEW COMPARISON:  04/08/2014 FINDINGS: Lungs are somewhat hypoinflated without focal airspace consolidation or effusion. Cardiomediastinal silhouette and remainder of the exam is unchanged. IMPRESSION: No active disease. Electronically Signed   By: Marin Olp M.D.   On: 09/03/2018 15:42     Assessment & Plan    1. Pleuritic Chest Discomfort - presented with 2 hours of constant chest discomfort which was worse with deep breathing and not associated with exertion. He walks on a daily basis and denies any associated chest pain or dyspnea with these activities.  -  Initial and cyclic troponin values have been negative. EKG shows NSR, HR 91, with LVH and slight ST depression along lateral leads, most consistent with repol abnormality.  - while he has no personal history of CAD, he does have multiple cardiac risk factors including HTN, HLD, and Type 2 DM, along with his age. Notes from the admitting team mention obtaining an echo but  this has not been ordered. Will add at this time. If no significant abnormalities, would anticipate the patient can undergo outpatient work-up with  repeat NST or CTA (NST would be Lexiscan at this time given COVID restrictions and given that he did not achieve optional workload by prior Treadmill Myoview).  - continue ASA and statin therapy.   2. HTN - BP has been variable at 128/58 - 164/73 since admission, improved to 141/70 on most recent check. - continue PTA Amlodipine and Losartan.   3. HLD - followed by PCP. Remains on Atorvastatin 20mg  daily.   4. IDDM - on Metformin, Glipizide, and Lantus 35U PTA. Hgb A1c pending.    For questions or updates, please contact [facility] Please consult [email or web address] for contact info under Cardiology/STEMI.  Signed, Erma Heritage, PA-C 09/04/2018, 7:46 AM Pager: [phone]  The patient was seen and examined, and I agree with the history, physical exam, assessment and plan as documented above, with modifications as noted below as well as to the physical exam. I have also personally reviewed all relevant documentation, old records, labs, and both radiographic and cardiovascular studies. I have also independently interpreted old and new ECG's.  Briefly, this is an 85 year old male with multiple cardiovascular risk factors as detailed above.  He had eaten lunch and went to the bedroom to watch TV.  He then experienced significant retrosternal chest pain radiating to the middle of his back.  He took 2 Tylenol without relief.  It was made worse with deep breathing.  He then called his PCP who instructed him to come to the ED.  ECG reviewed above.  Troponins are normal.  Chest pain was relieved with 1 sublingual nitroglycerin tablet.  I just personally reviewed the echocardiogram which demonstrates normal left ventricular systolic function and regional wall motion, LVEF 60 to 65%, with grade 1 diastolic dysfunction and aortic valve sclerosis without stenosis.  He is currently chest pain-free and also denies shortness of breath, palpitations, fevers and chills.    I think he can be  discharged and the patient would like to go home.  I will arrange for an outpatient coronary CT angiogram and then follow-up with either myself or an APP.   Kate Sable, MD, [facility]  09/04/2018 10:10 AM

## 2018-09-04 NOTE — Progress Notes (Signed)
PROGRESS NOTE  Alexander Clayton ZYY:[id number] DOB: 31-Jan-1928 DOA: 09/03/2018 PCP: Asencion Noble, MD  Brief History:   85 year old male with a history of diabetes mellitus, hypertension, hyperlipidemia, transitional cell carcinoma of the bladder presenting with chest discomfort starting around 1 PM on 09/03/2018.  The patient describes it as a pressure-like sensation in the central area of his chest radiating into his back.  He stated it lasted approximately 2 hours.  He denied any diaphoresis, dizziness, shortness of breath, nausea.  He denied fever, chills, coughing, hemoptysis, nausea, vomiting, diarrhea.  It was somewhat pleuritic with some worsening with deep breath.  He states that he has been walking 1/2 mile per day without any chest discomfort or worsening shortness of breath. He presented to the emergency department for further evaluation.  He was afebrile hemodynamically stable saturating 97% on room air.  BMP showed a serum potassium of 3.3 with serum creatinine 1.3.  WBC was 7.4.  EKG shows sinus rhythm with nonspecific T wave changes.  Chest x-ray was negative.  Assessment/Plan: Atypical chest pain -Troponins negative x3 -Cardiology consult -Echocardiogram -Continue aspirin  CKD stage 3 -baseline creatinine 1.0-1.3  Diabetes mellitus type 2 -Check hemoglobin A1c -Continue reduced dose Lantus -NovoLog sliding scale  Essential hypertension -Continue amlodipine, Lozol, losartan  Hyperlipidemia -Continue statin  Hypothyroidism -Continue Synthroid      Disposition Plan:   Home when cleared by cardiology  Family Communication:   No Family at bedside  Consultants:  cardiology  Code Status:  FULL / DNR  DVT Prophylaxis:  [place] Lovenox   Procedures: As Listed in Progress Note Above  Antibiotics: None      Subjective: Patient denies fevers, chills, headache, chest pain, dyspnea, nausea, vomiting, diarrhea, abdominal pain, dysuria, hematuria,  hematochezia, and melena.   Objective: Vitals:   09/03/18 1830 09/03/18 1930 09/03/18 2049 09/04/18 0608  BP: (!) 143/73 136/68 (!) 164/63 (!) 141/70  Pulse: (!) 49 (!) 58 67 63  Resp: 13 13 20 20   Temp:   98 F (36.7 C) 98.2 F (36.8 C)  TempSrc:   Oral Oral  SpO2: 96% 98% 97% 97%  Weight:   93 kg   Height:   5\' 7"  (1.702 m)     Intake/Output Summary (Last 24 hours) at 09/04/2018 0704 Last data filed at 09/03/2018 2100 Gross per 24 hour  Intake 240 ml  Output -  Net 240 ml   Weight change:  Exam:   General:  Pt is alert, follows commands appropriately, not in acute distress  HEENT: No icterus, No thrush, No neck mass, [place]/AT  Cardiovascular: RRR, S1/S2, no rubs, no gallops  Respiratory: CTA bilaterally, no wheezing, no crackles, no rhonchi  Abdomen: Soft/+BS, non tender, non distended, no guarding  Extremities: No edema, No lymphangitis, No petechiae, No rashes, no synovitis   Data Reviewed: I have personally reviewed following labs and imaging studies Basic Metabolic Panel: Recent Labs  Lab 09/03/18 1517  NA 134*  K 3.3*  CL 98  CO2 24  GLUCOSE 269*  BUN 23  CREATININE 1.38*  CALCIUM 9.0   Liver Function Tests: No results for input(s): AST, ALT, ALKPHOS, BILITOT, PROT, ALBUMIN in the last 168 hours. No results for input(s): LIPASE, AMYLASE in the last 168 hours. No results for input(s): AMMONIA in the last 168 hours. Coagulation Profile: No results for input(s): INR, PROTIME in the last 168 hours. CBC: Recent Labs  Lab 09/03/18 1517  WBC  11.4*  HGB 14.3  HCT 39.9  MCV 89.7  PLT 329   Cardiac Enzymes: Recent Labs  Lab 09/03/18 1517 09/03/18 1803 09/03/18 2342 09/04/18 0535  TROPONINI <0.03 <0.03 <0.03 <0.03   BNP: Invalid input(s): POCBNP CBG: Recent Labs  Lab 09/03/18 1527 09/03/18 2052 09/04/18 0553  GLUCAP 277* 178* 128*   HbA1C: No results for input(s): HGBA1C in the last 72 hours. Urine analysis: No results found for:  COLORURINE, APPEARANCEUR, LABSPEC, PHURINE, GLUCOSEU, HGBUR, BILIRUBINUR, KETONESUR, PROTEINUR, UROBILINOGEN, NITRITE, LEUKOCYTESUR Sepsis Labs: @LABRCNTIP (procalcitonin:4,lacticidven:4) ) Recent Results (from the past 240 hour(s))  SARS Coronavirus 2 (CEPHEID - Performed in [facility] hospital lab), Hosp Order     Status: None   Collection Time: 09/03/18  6:00 PM  Result Value Ref Range Status   SARS Coronavirus 2 NEGATIVE NEGATIVE Final    Comment: (NOTE) If result is NEGATIVE SARS-CoV-2 target nucleic acids are NOT DETECTED. The SARS-CoV-2 RNA is generally detectable in upper and lower  respiratory specimens during the acute phase of infection. The lowest  concentration of SARS-CoV-2 viral copies this assay can detect is 250  copies / mL. A negative result does not preclude SARS-CoV-2 infection  and should not be used as the sole basis for treatment or other  patient management decisions.  A negative result may occur with  improper specimen collection / handling, submission of specimen other  than nasopharyngeal swab, presence of viral mutation(s) within the  areas targeted by this assay, and inadequate number of viral copies  (<250 copies / mL). A negative result must be combined with clinical  observations, patient history, and epidemiological information. If result is POSITIVE SARS-CoV-2 target nucleic acids are DETECTED. The SARS-CoV-2 RNA is generally detectable in upper and lower  respiratory specimens dur ing the acute phase of infection.  Positive  results are indicative of active infection with SARS-CoV-2.  Clinical  correlation with patient history and other diagnostic information is  necessary to determine patient infection status.  Positive results do  not rule out bacterial infection or co-infection with other viruses. If result is PRESUMPTIVE POSTIVE SARS-CoV-2 nucleic acids MAY BE PRESENT.   A presumptive positive result was obtained on the submitted specimen   and confirmed on repeat testing.  While 2019 novel coronavirus  (SARS-CoV-2) nucleic acids may be present in the submitted sample  additional confirmatory testing may be necessary for epidemiological  and / or clinical management purposes  to differentiate between  SARS-CoV-2 and other Sarbecovirus currently known to infect humans.  If clinically indicated additional testing with an alternate test  methodology [phone]) is advised. The SARS-CoV-2 RNA is generally  detectable in upper and lower respiratory sp ecimens during the acute  phase of infection. The expected result is Negative. Fact Sheet for Patients:  StrictlyIdeas.no Fact Sheet for Healthcare Providers: BankingDealers.co.za This test is not yet approved or cleared by the [place] FDA and has been authorized for detection and/or diagnosis of SARS-CoV-2 by FDA under an Emergency Use Authorization (EUA).  This EUA will remain in effect (meaning this test can be used) for the duration of the COVID-19 declaration under Section 564(b)(1) of the Act, 21 U.S.C. section 360bbb-3(b)(1), unless the authorization is terminated or revoked sooner. Performed at [facility], [address] [place], [place] [address]      Scheduled Meds: . amLODipine  10 mg Oral q morning - 10a  . aspirin EC  81 mg Oral QHS  . atorvastatin  20 mg Oral QHS  .  enoxaparin (LOVENOX) injection  40 mg Subcutaneous Q24H  . gabapentin  300 mg Oral BID  . indapamide  2.5 mg Oral q morning - 10a  . insulin aspart  0-15 Units Subcutaneous Q6H  . insulin glargine  10 Units Subcutaneous QHS  . levothyroxine  75 mcg Oral Q0600  . loratadine  10 mg Oral Daily  . losartan  100 mg Oral q morning - 10a   Continuous Infusions:  Procedures/Studies: Dg Chest Portable 1 View  Result Date: 09/03/2018 CLINICAL DATA:  Mid chest pain beginning today with pain between shoulder blades and shortness of breath. EXAM:  PORTABLE CHEST 1 VIEW COMPARISON:  04/08/2014 FINDINGS: Lungs are somewhat hypoinflated without focal airspace consolidation or effusion. Cardiomediastinal silhouette and remainder of the exam is unchanged. IMPRESSION: No active disease. Electronically Signed   By: Marin Olp M.D.   On: 09/03/2018 15:42    Orson Eva, DO  [facility] Pager [phone]  If 7PM-7AM, please contact night-coverage [email or web address] Password [email or web address] 09/04/2018, 7:04 AM   LOS: 0 days

## 2018-09-04 NOTE — Progress Notes (Signed)
IV removed, 2x2 gauze applied to site, patient tolerated well. Reviewed AVS with patient who verbalized understanding.  Patient transported to lobby via wheelchair and transported home by his daughter.

## 2018-09-04 NOTE — Telephone Encounter (Signed)
-----   Message from Erma Heritage, [place] sent at 09/04/2018 10:47 AM EDT ----- Regarding: Coronary CT Hi Kisha,   This patient is being discharged today and needs an Outpatient Coronary CT per Dr. Bronson Ing for chest pain. Could you please help with orders? I made him a follow-up in late June but that might need to be pushed out depending on when the scan takes place.   Thanks,  [place]

## 2018-09-06 NOTE — Telephone Encounter (Signed)
Pt notified and voiced understanding 

## 2018-09-27 DIAGNOSIS — Z0120 Encounter for dental examination and cleaning without abnormal findings: Secondary | ICD-10-CM | POA: Diagnosis not present

## 2018-10-10 ENCOUNTER — Other Ambulatory Visit (HOSPITAL_COMMUNITY)
Admission: RE | Admit: 2018-10-10 | Discharge: 2018-10-10 | Disposition: A | Discharge status: Home / Self Care | Payer: Medicare Other | Source: Ambulatory Visit | Attending: Cardiovascular Disease | Admitting: Cardiovascular Disease

## 2018-10-10 ENCOUNTER — Other Ambulatory Visit: Payer: Self-pay

## 2018-10-10 DIAGNOSIS — R079 Chest pain, unspecified: Secondary | ICD-10-CM | POA: Diagnosis not present

## 2018-10-10 LAB — BASIC METABOLIC PANEL
Anion gap: 14 (ref 5–15)
BUN: 21 mg/dL (ref 8–23)
CO2: 25 mmol/L (ref 22–32)
Calcium: 8.9 mg/dL (ref 8.9–10.3)
Chloride: 96 mmol/L — ABNORMAL LOW (ref 98–111)
Creatinine, Ser: 1.4 mg/dL — ABNORMAL HIGH (ref 0.61–1.24)
GFR calc Af Amer: 52 mL/min — ABNORMAL LOW (ref >60)
GFR calc non Af Amer: 45 mL/min — ABNORMAL LOW (ref >60)
Glucose, Bld: 284 mg/dL — ABNORMAL HIGH (ref 70–99)
Potassium: 3.5 mmol/L (ref 3.5–5.1)
Sodium: 135 mmol/L (ref 135–145)

## 2018-10-16 ENCOUNTER — Telehealth: Payer: Self-pay | Admitting: Student

## 2018-10-16 NOTE — Telephone Encounter (Signed)
Per cardiac CT instructions, pt will hold metformin 24 hours prior to test.Pt takes insulin at 8 pm and will have breakfast the day of test which is in the afternoon

## 2018-10-16 NOTE — Telephone Encounter (Signed)
Patient has questions regarding instructions for his Metformin and Glipizide prior to Cardiac CT / tg

## 2018-10-18 ENCOUNTER — Ambulatory Visit: Payer: Medicare Other | Admitting: Student

## 2018-10-19 ENCOUNTER — Telehealth (HOSPITAL_COMMUNITY): Payer: Self-pay | Admitting: Emergency Medicine

## 2018-10-19 ENCOUNTER — Other Ambulatory Visit (HOSPITAL_COMMUNITY): Payer: Self-pay | Admitting: Emergency Medicine

## 2018-10-19 DIAGNOSIS — R079 Chest pain, unspecified: Secondary | ICD-10-CM

## 2018-10-19 NOTE — Telephone Encounter (Signed)
Reaching out to patient to offer assistance regarding upcoming cardiac imaging study; pt verbalizes understanding of appt date/time, parking situation and where to check in, pre-test NPO status and medications ordered, and verified current allergies; name and call back number provided for further questions should they arise Assad Harbeson RN Navigator Cardiac Imaging [facility] [phone] office [phone] cell  Pt denies covid symptoms, verbalized understanding of visitor policy. 

## 2018-10-22 ENCOUNTER — Other Ambulatory Visit: Payer: Self-pay

## 2018-10-22 ENCOUNTER — Encounter (HOSPITAL_COMMUNITY): Payer: Self-pay

## 2018-10-22 ENCOUNTER — Ambulatory Visit (HOSPITAL_COMMUNITY)
Admission: RE | Admit: 2018-10-22 | Discharge: 2018-10-22 | Disposition: A | Discharge status: Home / Self Care | Payer: Medicare Other | Source: Ambulatory Visit | Attending: Cardiovascular Disease | Admitting: Cardiovascular Disease

## 2018-10-22 DIAGNOSIS — I2510 Atherosclerotic heart disease of native coronary artery without angina pectoris: Secondary | ICD-10-CM | POA: Diagnosis not present

## 2018-10-22 DIAGNOSIS — I700 Atherosclerosis of aorta: Secondary | ICD-10-CM | POA: Diagnosis not present

## 2018-10-22 DIAGNOSIS — R079 Chest pain, unspecified: Secondary | ICD-10-CM | POA: Insufficient documentation

## 2018-10-22 LAB — POCT I-STAT CREATININE: Creatinine, Ser: 1.1 mg/dL (ref 0.61–1.24)

## 2018-10-22 MED ORDER — METOPROLOL TARTRATE 5 MG/5ML IV SOLN
INTRAVENOUS | Status: AC
  Administered 2018-10-22: 5 mg via INTRAVENOUS
  Filled 2018-10-22: qty 10

## 2018-10-22 MED ORDER — NITROGLYCERIN 0.4 MG SL SUBL
SUBLINGUAL_TABLET | SUBLINGUAL | Status: AC
  Administered 2018-10-22: 0.8 mg via SUBLINGUAL
  Filled 2018-10-22: qty 2

## 2018-10-22 MED ORDER — METOPROLOL TARTRATE 5 MG/5ML IV SOLN
5.0000 mg | INTRAVENOUS | Status: DC | PRN
  Administered 2018-10-22: 5 mg via INTRAVENOUS

## 2018-10-22 MED ORDER — NITROGLYCERIN 0.4 MG SL SUBL
0.8000 mg | SUBLINGUAL_TABLET | Freq: Once | SUBLINGUAL | Status: AC
  Administered 2018-10-22: 0.8 mg via SUBLINGUAL

## 2018-10-22 MED ORDER — IOHEXOL 350 MG/ML SOLN
80.0000 mL | Freq: Once | INTRAVENOUS | Status: AC | PRN
  Administered 2018-10-22: 80 mL via INTRAVENOUS

## 2018-10-23 ENCOUNTER — Telehealth: Payer: Self-pay

## 2018-10-23 DIAGNOSIS — I2510 Atherosclerotic heart disease of native coronary artery without angina pectoris: Secondary | ICD-10-CM | POA: Diagnosis not present

## 2018-10-23 MED ORDER — ISOSORBIDE MONONITRATE ER 30 MG PO TB24: 30.0000 mg | ORAL_TABLET | Freq: Every day | ORAL | 3 refills | Status: DC

## 2018-10-23 NOTE — Telephone Encounter (Signed)
Pt states he had one episode of CP, will try Imdur for a month and let us know

## 2018-10-23 NOTE — Telephone Encounter (Signed)
-----   Message from Alexander Commons, MD sent at 10/23/2018  3:56 PM EDT ----- He has CAD for which medical therapy is recommended. If he is having chest pain, I would recommend starting Imdur 30 mg daily.

## 2018-11-05 DIAGNOSIS — E1140 Type 2 diabetes mellitus with diabetic neuropathy, unspecified: Secondary | ICD-10-CM | POA: Diagnosis not present

## 2018-11-05 DIAGNOSIS — E1129 Type 2 diabetes mellitus with other diabetic kidney complication: Secondary | ICD-10-CM | POA: Diagnosis not present

## 2018-11-07 ENCOUNTER — Other Ambulatory Visit: Payer: Self-pay

## 2018-11-07 ENCOUNTER — Ambulatory Visit: Payer: Medicare Other | Admitting: Student

## 2018-11-07 ENCOUNTER — Encounter: Payer: Self-pay | Admitting: Student

## 2018-11-07 VITALS — BP 142/68 | HR 75 | Temp 99.0°F | Ht 67.5 in | Wt 207.0 lb

## 2018-11-07 DIAGNOSIS — E785 Hyperlipidemia, unspecified: Secondary | ICD-10-CM | POA: Diagnosis not present

## 2018-11-07 DIAGNOSIS — I10 Essential (primary) hypertension: Secondary | ICD-10-CM

## 2018-11-07 DIAGNOSIS — I2510 Atherosclerotic heart disease of native coronary artery without angina pectoris: Secondary | ICD-10-CM

## 2018-11-07 DIAGNOSIS — E119 Type 2 diabetes mellitus without complications: Secondary | ICD-10-CM

## 2018-11-07 DIAGNOSIS — Z794 Long term (current) use of insulin: Secondary | ICD-10-CM

## 2018-11-07 NOTE — Patient Instructions (Signed)
Medication Instructions:  Stop imdur  Labwork: none  Testing/Procedures: None  Follow-Up: Your physician wants you to follow-up in: 6 months .  You will receive a reminder letter in the mail two months in advance. If you don't receive a letter, please call our office to schedule the follow-up appointment.    Any Other Special Instructions Will Be Listed Below (If Applicable).     If you need a refill on your cardiac medications before your next appointment, please call your pharmacy.

## 2018-11-07 NOTE — Progress Notes (Signed)
Cardiology Office Note    Date:  11/07/2018   ID:  SHIVEN JUNIOUS, DOB [birth year], MRN [id number]  PCP:  Asencion Noble, MD  Cardiologist: Dorris Carnes, MD  (last evaluated in 2017)  Chief Complaint  Patient presents with  . Follow-up    s/p Coronary CT    History of Present Illness:    Alexander Clayton is a 84 y.o. male with past medical history of HTN, HLD, Type 2 DM and bladder cancer (s/p immunotherapy) who presents to the office today for follow-up of his Coronary CT.   He was admitted to [facility] in 08/2018 for evaluation of chest discomfort which lasted for approximately 2 hours. EKG showed no acute ischemic changes and cyclic troponin values remain negative. An echocardiogram was performed which showed a preserved EF of 60 to 65% with grade 1 diastolic dysfunction and no regional wall motion abnormalities. He was discharged home with an outpatient Coronary CT recommended. This was initially delayed due to COVID-19 but performed on 10/21/2017 and showed moderate to severe diffuse calcified plaque with moderate stenosis in the mid and distal RCA, proximal LAD, proximal portion of ramus intermedius and proximal and mid portion of non-dominant LCX artery. FFR was performed and showed stenoses in very distal portions of LAD, ramus intermedius and in the mid portion of a non-dominant LCX artery with aggressive medical management recommended. Was started on Imdur 30mg  daily while continuing on ASA, statin, and BB therapy.   In talking with the patient today, he denies any recurrent episodes of chest pain since his admission in 08/2018. Reported overall doing well at the time Imdur was added and did not experience any benefit in his symptoms with the addition of the medication as he was overall feeling well. No recent dyspnea on exertion, orthopnea, PND, or lower extremity edema.   He is the primary caregiver for his wife and performs outdoor work along with cooking, cleaning, and doing  laundry without any symptoms. Walks approximately a mile per day for exercise without any reported anginal symptoms.     Past Medical History:  Diagnosis Date  . Bladder cancer ([place])   . CAD (coronary artery disease)   . Diabetes mellitus without complication ([place])   . Hypercholesteremia   . Hypertension     Past Surgical History:  Procedure Laterality Date  . APPENDECTOMY    . HERNIA REPAIR      Current Medications: Outpatient Medications Prior to Visit  Medication Sig Dispense Refill  . acetaminophen (TYLENOL) 500 MG tablet Take 1,000 mg by mouth 3 (three) times daily.     Marland Kitchen amLODipine (NORVASC) 10 MG tablet Take 10 mg by mouth every morning.     Marland Kitchen aspirin EC 81 MG tablet Take 81 mg by mouth at bedtime.     Marland Kitchen atorvastatin (LIPITOR) 20 MG tablet Take 20 mg by mouth at bedtime.     . cetirizine (ZYRTEC) 10 MG tablet Take 10 mg by mouth at bedtime.     . gabapentin (NEURONTIN) 300 MG capsule Take 300 mg by mouth 2 (two) times a day.    Marland Kitchen glipiZIDE (GLUCOTROL) 5 MG tablet Take 5 mg by mouth every morning.     . indapamide (LOZOL) 2.5 MG tablet Take 2.5 mg by mouth every morning.     . insulin glargine (LANTUS) 100 UNIT/ML injection Inject 35 Units into the skin at bedtime.     Marland Kitchen levothyroxine (SYNTHROID) 75 MCG tablet Take 75 mcg by mouth daily  before breakfast.    . losartan (COZAAR) 100 MG tablet Take 100 mg by mouth every morning.    . metFORMIN (GLUCOPHAGE) 500 MG tablet Take 500 mg by mouth 2 (two) times daily.     . Multiple Vitamin (MULTIVITAMIN) capsule Take 1 capsule by mouth at bedtime.     . Multiple Vitamins-Minerals (ICAPS AREDS 2 PO) Take 1 tablet by mouth 2 (two) times a day.     . isosorbide mononitrate (IMDUR) 30 MG 24 hr tablet Take 1 tablet (30 mg total) by mouth daily. 30 tablet 3  . metoprolol tartrate (LOPRESSOR) 50 MG tablet Take 1 tablet (50 mg total) by mouth once for 1 dose. Take 2 hours prior to CT scan 1 tablet 0   No facility-administered medications  prior to visit.      Allergies:   Ciprofloxacin and Vioxx [rofecoxib]   Social History   Socioeconomic History  . Marital status: Married    Spouse name: Not on file  . Number of children: Not on file  . Years of education: Not on file  . Highest education level: Not on file  Occupational History  . Not on file  Social Needs  . Financial resource strain: Not on file  . Food insecurity    Worry: Not on file    Inability: Not on file  . Transportation needs    Medical: Not on file    Non-medical: Not on file  Tobacco Use  . Smoking status: Never Smoker  . Smokeless tobacco: Never Used  Substance and Sexual Activity  . Alcohol use: No    Alcohol/week: 0.0 standard drinks  . Drug use: No  . Sexual activity: Not on file  Lifestyle  . Physical activity    Days per week: Not on file    Minutes per session: Not on file  . Stress: Not on file  Relationships  . Social Herbalist on phone: Not on file    Gets together: Not on file    Attends religious service: Not on file    Active member of club or organization: Not on file    Attends meetings of clubs or organizations: Not on file    Relationship status: Not on file  Other Topics Concern  . Not on file  Social History Narrative  . Not on file     Family History:  The patient's family history includes Hypertension in his father.   Review of Systems:   Please see the history of present illness.     General:  No chills, fever, night sweats or weight changes.  Cardiovascular:  No chest pain, dyspnea on exertion, edema, orthopnea, palpitations, paroxysmal nocturnal dyspnea. Dermatological: No rash, lesions/masses Respiratory: No cough, dyspnea Urologic: No hematuria, dysuria Abdominal:   No nausea, vomiting, diarrhea, bright red blood per rectum, melena, or hematemesis Neurologic:  No visual changes, wkns, changes in mental status.  He denies any of the above symptoms.   All other systems reviewed and are  otherwise negative except as noted above.   Physical Exam:    VS:  BP (!) 142/68   Pulse 75   Temp 99 F (37.2 C)   Ht 5' 7.5" (1.715 m)   Wt 207 lb (93.9 kg)   BMI 31.94 kg/m    General: Well developed, well nourished,male appearing in no acute distress. Head: Normocephalic, atraumatic, sclera non-icteric, no xanthomas, nares are without discharge.  Neck: No carotid bruits. JVD not elevated.  Lungs:  Respirations regular and unlabored, without wheezes or rales.  Heart: Regular rate and rhythm. No S3 or S4.  No murmur, no rubs, or gallops appreciated. Abdomen: Soft, non-tender, non-distended with normoactive bowel sounds. No hepatomegaly. No rebound/guarding. No obvious abdominal masses. Msk:  Strength and tone appear normal for age. No joint deformities or effusions. Extremities: No clubbing or cyanosis. Trace ankle edema bilaterally.  Distal pedal pulses are 2+ bilaterally. Neuro: Alert and oriented X 3. Moves all extremities spontaneously. No focal deficits noted. Psych:  Responds to questions appropriately with a normal affect. Skin: No rashes or lesions noted  Wt Readings from Last 3 Encounters:  11/07/18 207 lb (93.9 kg)  09/03/18 205 lb 1.6 oz (93 kg)  08/17/15 195 lb (88.5 kg)     Studies/Labs Reviewed:   EKG:  EKG is not ordered today.   Recent Labs: 09/03/2018: Hemoglobin 14.3; Platelets 329 10/10/2018: BUN 21; Potassium 3.5; Sodium 135 10/22/2018: Creatinine, Ser 1.10   Lipid Panel No results found for: CHOL, TRIG, HDL, CHOLHDL, VLDL, LDLCALC, LDLDIRECT  Additional studies/ records that were reviewed today include:    Echocardiogram: 08/2018 IMPRESSIONS    1. The left ventricle has normal systolic function with an ejection fraction of 60-65%. The cavity size was normal. Left ventricular diastolic Doppler parameters are consistent with impaired relaxation. No evidence of left ventricular regional wall  motion abnormalities.  2. The right ventricle has normal  systolic function. The cavity was normal. There is no increase in right ventricular wall thickness.  3. The mitral valve is grossly normal. There is mild mitral annular calcification present.  4. The tricuspid valve is grossly normal.  5. The aortic valve is grossly normal. Mild thickening of the aortic valve. Mild calcification of the aortic valve.  6. The aortic root is normal in size and structure.  7. The inferior vena cava was dilated in size with >50% respiratory variability.  Coronary CT: 09/2018 IMPRESSION: 1. Coronary calcium score of 3780. This was 83 percentile for age and sex matched control.  2. Normal coronary origin with right dominance.  3. Moderate to severe diffuse calcified plaque with moderate stenosis in the mid and distal RCA, proximal LAD, proximal portion of ramus intermedius and proximal and mid portion of non-dominant LCX artery. Additional analysis with CT FFR will be submitted.  FINDINGS: FFRct analysis was performed on the original cardiac CT angiogram dataset. Diagrammatic representation of the FFRct analysis is provided in a separate PDF document in PACS. This dictation was created using the PDF document and an interactive 3D model of the results. 3D model is not available in the EMR/PACS. Normal FFR range is >0.80.  1. Left Main: 0.98. 2. LAD: Proximal: 0.96.  Mid: 0.85.  Distal 0.77. 3. Ramus intermedius: Proximal: 0.93.  Mid: 0.80.  Distal: 0.76. 4. LCX: Proximal: 0.97.  Mid: 0.84.  Distal: 0.68. 5. RCA: Proximal: 0.99.  Mid: 0.90.  Distal: 0.80.  IMPRESSION: 1. CT FFR analysis showed stenoses in very portions of LAD, ramus intermedius and in the mid portion of a non-dominant LCX artery. Aggressive medical management is recommended.  Assessment:    1. Coronary artery disease involving native coronary artery of native heart without angina pectoris   2. Essential hypertension   3. Hyperlipidemia LDL goal <70   4. Type 2 diabetes mellitus  without complication, with long-term current use of insulin ([place])      Plan:   In order of problems listed above:  1. CAD - recent Coronary CT showed  stenoses in very distal portions of LAD, ramus intermedius and in the mid portion of a non-dominant LCX artery with aggressive medical management recommended. Reviewed results in detail with the patient today.  - he denies any recurrent chest pain within the past several months. Has been walking for approximately a mile per day for exercise without any reported anginal symptoms. He does not wish to remain on Imdur unless absolutely necessary as he was not experiencing any symptoms when this was initiated. Will therefore discontinue for now. Reviewed with him this can be restarted he notes any change in his symptoms. Continue ASA, Amlodipine, and statin therapy.   2. HTN - BP at 142/68 during today's visit. Reports overall well-controlled when checked at home. Will continue current regimen for now including Amlodipine, Lozol, and Losartan.   3. HLD - followed by PCP. He has been on Atorvastatin 20mg  daily and is unsure of what his most recent LDL was. Will request records from PCP. If LDL not at goal of less than 70 given known CAD, would recommend titration of this to 40mg  daily and rechecking FLP and LFT's 6-8 weeks following dose adjustment.   4. Type 2 DM - followed by PCP. Hgb A1c elevated to 8.2 in 08/2018.  Medication Adjustments/Labs and Tests Ordered: Current medicines are reviewed at length with the patient today.  Concerns regarding medicines are outlined above.  Medication changes, Labs and Tests ordered today are listed in the Patient Instructions below. Patient Instructions  Medication Instructions:  Stop imdur  Labwork: none  Testing/Procedures: None  Follow-Up: Your physician wants you to follow-up in: 6 months .  You will receive a reminder letter in the mail two months in advance. If you don't receive a letter, please  call our office to schedule the follow-up appointment.    Any Other Special Instructions Will Be Listed Below (If Applicable).     If you need a refill on your cardiac medications before your next appointment, please call your pharmacy.      Signed, Erma Heritage, PA-C  11/07/2018 8:33 PM    [facility] [address] [place], [place] [address] Phone: [phone] Fax: [phone]

## 2018-11-12 DIAGNOSIS — I10 Essential (primary) hypertension: Secondary | ICD-10-CM | POA: Diagnosis not present

## 2018-11-12 DIAGNOSIS — I2510 Atherosclerotic heart disease of native coronary artery without angina pectoris: Secondary | ICD-10-CM | POA: Diagnosis not present

## 2018-11-12 DIAGNOSIS — E1129 Type 2 diabetes mellitus with other diabetic kidney complication: Secondary | ICD-10-CM | POA: Diagnosis not present

## 2019-01-07 DIAGNOSIS — N401 Enlarged prostate with lower urinary tract symptoms: Secondary | ICD-10-CM | POA: Diagnosis not present

## 2019-01-07 DIAGNOSIS — R972 Elevated prostate specific antigen [PSA]: Secondary | ICD-10-CM | POA: Diagnosis not present

## 2019-01-07 DIAGNOSIS — N138 Other obstructive and reflux uropathy: Secondary | ICD-10-CM | POA: Diagnosis not present

## 2019-01-07 DIAGNOSIS — Z8551 Personal history of malignant neoplasm of bladder: Secondary | ICD-10-CM | POA: Diagnosis not present

## 2019-01-22 DIAGNOSIS — Z23 Encounter for immunization: Secondary | ICD-10-CM | POA: Diagnosis not present

## 2019-02-12 DIAGNOSIS — E1129 Type 2 diabetes mellitus with other diabetic kidney complication: Secondary | ICD-10-CM | POA: Diagnosis not present

## 2019-02-12 DIAGNOSIS — E039 Hypothyroidism, unspecified: Secondary | ICD-10-CM | POA: Diagnosis not present

## 2019-02-12 DIAGNOSIS — E785 Hyperlipidemia, unspecified: Secondary | ICD-10-CM | POA: Diagnosis not present

## 2019-02-12 DIAGNOSIS — E876 Hypokalemia: Secondary | ICD-10-CM | POA: Diagnosis not present

## 2019-04-04 DIAGNOSIS — Z0120 Encounter for dental examination and cleaning without abnormal findings: Secondary | ICD-10-CM | POA: Diagnosis not present

## 2019-05-13 DIAGNOSIS — E785 Hyperlipidemia, unspecified: Secondary | ICD-10-CM | POA: Diagnosis not present

## 2019-05-13 DIAGNOSIS — E876 Hypokalemia: Secondary | ICD-10-CM | POA: Diagnosis not present

## 2019-05-13 DIAGNOSIS — E039 Hypothyroidism, unspecified: Secondary | ICD-10-CM | POA: Diagnosis not present

## 2019-05-13 DIAGNOSIS — E1129 Type 2 diabetes mellitus with other diabetic kidney complication: Secondary | ICD-10-CM | POA: Diagnosis not present

## 2019-05-14 ENCOUNTER — Encounter: Payer: Self-pay | Admitting: Physician Assistant

## 2019-05-14 ENCOUNTER — Ambulatory Visit: Payer: Medicare Other | Admitting: Physician Assistant

## 2019-05-14 VITALS — BP 168/74 | HR 85 | Temp 97.7°F | Ht 67.5 in | Wt 209.0 lb

## 2019-05-14 DIAGNOSIS — N183 Chronic kidney disease, stage 3 unspecified: Secondary | ICD-10-CM

## 2019-05-14 DIAGNOSIS — E785 Hyperlipidemia, unspecified: Secondary | ICD-10-CM

## 2019-05-14 DIAGNOSIS — I2510 Atherosclerotic heart disease of native coronary artery without angina pectoris: Secondary | ICD-10-CM | POA: Diagnosis not present

## 2019-05-14 DIAGNOSIS — E876 Hypokalemia: Secondary | ICD-10-CM

## 2019-05-14 DIAGNOSIS — I10 Essential (primary) hypertension: Secondary | ICD-10-CM

## 2019-05-14 NOTE — Progress Notes (Addendum)
Cardiology Office Note    Date:  05/14/2019   ID:  Alexander Clayton, DOB Jun 09, 1927, MRN [id number]  PCP:  Asencion Noble, MD  Cardiologist:  Dorris Carnes, MD  Electrophysiologist:  None   Chief Complaint: 6 month f/u CAD  History of Present Illness:   Alexander Clayton is a 86 y.o. male with history of HTN, HLD, Type 2 DM and bladder cancer (s/p immunotherapy), CAD by coronary CTA 09/2018, suspected CKD stage III by labs, hepatic steatosis, hiatal hernia who presents to the office today for follow-up of CAD.  To recap, he was admitted to [facility] 08/2018 for chest pain and ruled out for MI. 2D echo 09/04/18 showed EF 60-65%, impaired diastolic relaxation, dilated IVC, normal RV function, mild calcification of aortic valve but otherwise no acute findings. CTA was negative for PE but other ancillary findings included coronary calcification, probable granulomas in RUL lung, small hiatal hernia, hepatic steatosis, left adrenal adenoma, diffuse idiopathic skeletal hyperostosis in the thoracic spine.  He was discharged home with an outpatient Coronary CT recommended. This was initially delayed due to COVID-19 but performed in 09/2018 and showed moderate to severe diffuse calcified plaque with moderate stenosis in the mid and distal RCA, proximal LAD, proximal portion of ramus intermedius and proximal and mid portion of non-dominant LCX artery. FFR was performed and showed stenoses in very distal portions of LAD, ramus intermedius and in the mid portion of a non-dominant LCX artery. Aggressive medical management recommended. He was started on Imdur 30mg  daily while continuing on ASA and statin therapy. There is reference to continuing BB but do not see he was ever on this except for coronary CTA pre-dose. At last f/u in 10/2018 he was doing well, walking regularly, no anginal symptoms. He did not wish to remain on Imdur because he was acutally feeling better when it had been started (and saw no change in taking  it). He brings in copy of [place] labs from 01/2019 which I personally reviewed - A1c 8.6, Hgb 14.4, K 3.4, TSH 4.1, LFTs wnl, LDL 53, HDL 36, trig 271.   He returns for follow-up feeling great from a cardiac standpoint. His activity is limited due to chronic knee arthritis pain but no chest pain, dyspnea, orthopnea, palpitations, syncope. He has chronic trace sockline edema with know change. In discussing low potassium level from October labs, he reports Dr. Willey Blade added KCl 34meq daily, rechecked labs this AM, and he has a virtual visit with him tomorrow to discuss.   Past Medical History:  Diagnosis Date  . Bladder cancer ([place])   . CAD (coronary artery disease)    a. per coronary CTA 09/2018 - managed medically.  . CKD (chronic kidney disease), stage III   . Diabetes mellitus without complication ([place])   . Hepatic steatosis    by CT 08/2018  . Hiatal hernia    small by CT 08/2018  . Hypercholesteremia   . Hypertension     Past Surgical History:  Procedure Laterality Date  . APPENDECTOMY    . HERNIA REPAIR      Current Medications: Current Meds  Medication Sig  . acetaminophen (TYLENOL) 500 MG tablet Take 1,000 mg by mouth 3 (three) times daily.   Marland Kitchen amLODipine (NORVASC) 10 MG tablet Take 10 mg by mouth every morning.   Marland Kitchen aspirin EC 81 MG tablet Take 81 mg by mouth at bedtime.   Marland Kitchen atorvastatin (LIPITOR) 20 MG tablet Take 20 mg by mouth at bedtime.   Marland Kitchen  cetirizine (ZYRTEC) 10 MG tablet Take 10 mg by mouth at bedtime.   . gabapentin (NEURONTIN) 300 MG capsule Take 300 mg by mouth 2 (two) times a day.  Marland Kitchen glipiZIDE (GLUCOTROL) 5 MG tablet Take 5 mg by mouth every morning.   . indapamide (LOZOL) 2.5 MG tablet Take 2.5 mg by mouth every morning.   . insulin glargine (LANTUS) 100 UNIT/ML injection Inject 35 Units into the skin at bedtime.   Marland Kitchen levothyroxine (SYNTHROID) 75 MCG tablet Take 75 mcg by mouth daily before breakfast.  . losartan (COZAAR) 100 MG tablet Take 100 mg by mouth every  morning.  . metFORMIN (GLUCOPHAGE) 500 MG tablet Take 500 mg by mouth 2 (two) times daily.   . Multiple Vitamin (MULTIVITAMIN) capsule Take 1 capsule by mouth at bedtime.   . Multiple Vitamins-Minerals (ICAPS AREDS 2 PO) Take 1 tablet by mouth 2 (two) times a day.   + Potassium chloride 40meq Take 1 tablet by mouth daily per pt verbal report    Allergies:   Ciprofloxacin and Vioxx [rofecoxib]   Social History   Socioeconomic History  . Marital status: Married    Spouse name: Not on file  . Number of children: Not on file  . Years of education: Not on file  . Highest education level: Not on file  Occupational History  . Not on file  Tobacco Use  . Smoking status: Never Smoker  . Smokeless tobacco: Never Used  Substance and Sexual Activity  . Alcohol use: No    Alcohol/week: 0.0 standard drinks  . Drug use: No  . Sexual activity: Not on file  Other Topics Concern  . Not on file  Social History Narrative  . Not on file   Social Determinants of Health   Financial Resource Strain:   . Difficulty of Paying Living Expenses: Not on file  Food Insecurity:   . Worried About Charity fundraiser in the Last Year: Not on file  . Ran Out of Food in the Last Year: Not on file  Transportation Needs:   . Lack of Transportation (Medical): Not on file  . Lack of Transportation (Non-Medical): Not on file  Physical Activity:   . Days of Exercise per Week: Not on file  . Minutes of Exercise per Session: Not on file  Stress:   . Feeling of Stress : Not on file  Social Connections:   . Frequency of Communication with Friends and Family: Not on file  . Frequency of Social Gatherings with Friends and Family: Not on file  . Attends Religious Services: Not on file  . Active Member of Clubs or Organizations: Not on file  . Attends Archivist Meetings: Not on file  . Marital Status: Not on file     Family History:  The patient's family history includes Hypertension in his  father.  ROS:   Please see the history of present illness.  All other systems are reviewed and otherwise negative.    EKGs/Labs/Other Studies Reviewed:    Studies reviewed are outlined and summarized above.  2D echo 08/2018  1. The left ventricle has normal systolic function with an ejection fraction of 60-65%. The cavity size was normal. Left ventricular diastolic Doppler parameters are consistent with impaired relaxation. No evidence of left ventricular regional wall  motion abnormalities.  2. The right ventricle has normal systolic function. The cavity was normal. There is no increase in right ventricular wall thickness.  3. The mitral valve is grossly  normal. There is mild mitral annular calcification present.  4. The tricuspid valve is grossly normal.  5. The aortic valve is grossly normal. Mild thickening of the aortic valve. Mild calcification of the aortic valve.  6. The aortic root is normal in size and structure.  7. The inferior vena cava was dilated in size with >50% respiratory variability.    EKG:  EKG is ordered today, personally reviewed, demonstrating NSR 88bpm, LVH with probable repolarization abnormalities, nonspecific STTW changes inferiorly and V3-V6  Recent Labs: 09/03/2018: Hemoglobin 14.3; Platelets 329 10/10/2018: BUN 21; Potassium 3.5; Sodium 135 10/22/2018: Creatinine, Ser 1.10  Recent Lipid Panel No results found for: CHOL, TRIG, HDL, CHOLHDL, VLDL, LDLCALC, LDLDIRECT  PHYSICAL EXAM:    VS:  BP (!) 168/74   Pulse 85   Temp 97.7 F (36.5 C) (Temporal)   Ht 5' 7.5" (1.715 m)   Wt 209 lb (94.8 kg)   SpO2 97%   BMI 32.25 kg/m   BMI: Body mass index is 32.25 kg/m.  GEN: Well nourished, well developed cheerful elderly WM, in no acute distress HEENT: normocephalic, atraumatic Neck: no JVD, carotid bruits, or masses Cardiac: RRR; no murmurs, rubs, or gallops, trace soft sockline edema bilaterally Respiratory:  clear to auscultation bilaterally, normal  work of breathing GI: soft, nontender, nondistended, + BS MS: no deformity or atrophy Skin: warm and dry, no rash Neuro:  Alert and Oriented x 3, Strength and sensation are intact, follows commands Psych: euthymic mood, full affect  Wt Readings from Last 3 Encounters:  05/14/19 209 lb (94.8 kg)  11/07/18 207 lb (93.9 kg)  09/03/18 205 lb 1.6 oz (93 kg)     ASSESSMENT & PLAN:   1. CAD - clinically stable on current regimen. Continue without changes today. 2. Essential HTN - BP elevated which he states is highly unusual. His EKG does show changes suggestive of LVH although he is totally asymptomatic. Gave him instructions to check BP once daily over the next few days and report BPs back on Friday for our review. Offered him a virtual visit to formally review in follow-up but he prefers just a simple phone call to the office. If they are elevated requiring further management, we most certainly will suggest further follow-up. He may require consideration of secondary w/u given that he's already on numerous blood pressure medications, although with his advanced age he expresses a preference not to rock the boat too much. If controlled, would not make any changes to his present regimen. 3. Hyperlipidemia - most recent LDL is controlled. Triglycerides were not, but I suspect likely due to DM. He does not wish to add any more medicines so will continue current regimen. 4. CKD stage III by labs - stable by recent labs in October. Discussed avoidance of NSAIDs. 5. Hypokalemia - being followed by primary care who added KCl 75meq daily. He had repeat labs today with his office, pending virtual visit with Dr. Willey Blade tomorrow.  Disposition: F/u with Dr. Harrington Challenger in 6 months.   Medication Adjustments/Labs and Tests Ordered: Current medicines are reviewed at length with the patient today.  Concerns regarding medicines are outlined above. Medication changes, Labs and Tests ordered today are summarized above and  listed in the Patient Instructions accessible in Encounters.    Signed, Alexander Pitter, PA-C  05/14/2019 3:46 PM    [facility] Location in [place]. [place], [place] [address] Ph: [phone]; Fax [phone]

## 2019-05-14 NOTE — Patient Instructions (Addendum)
  Medication Instructions:  Your physician recommends that you continue on your current medications as directed. Please refer to the Current Medication list given to you today.   Labwork: none  Testing/Procedures: none  Follow-Up: Your physician wants you to follow-up in:  6 months.  You will receive a reminder letter in the mail two months in advance. If you don't receive a letter, please call our office to schedule the follow-up appointment.'  Any Other Special Instructions Will Be Listed Below (If Applicable).     If you need a refill on your cardiac medications before your next appointment, please call your pharmacy.   Please get a blood pressure cuff that goes on your arm. The wrist ones can be inaccurate. To check your blood pressure, choose a time about 3 hours after taking your blood pressure medicines. Remain seated in a chair for 5 minutes quietly beforehand, then check it. When you get a cuff, please get those readings and call us on Friday with those readings.   Please ask Dr. Willey Blade to send [place] a copy of your lab results.

## 2019-05-17 ENCOUNTER — Telehealth: Payer: Self-pay

## 2019-05-17 NOTE — Telephone Encounter (Signed)
-----   Message from Holley Dexter sent at 05/17/2019  9:46 AM EST ----- Regarding: reporting BP checks Per Pt he was asked by Burna Mortimer to report 3 day BP checks Jan 20th 155/66 Jan 21st 148/64 Jan 22nd 142/63  Pt phone  [phone]  Thanks renee

## 2019-05-17 NOTE — Telephone Encounter (Signed)
I spoke with patient,he will limit his salt intake and try Mrs.Dash as replacement to salt. He will also speak with Dr.Fagan if his Systolic BP is above [id number]

## 2019-05-17 NOTE — Telephone Encounter (Signed)
Reviewed, thanks. Given his age, his diastolic (bottom) number in the 60s, the amount of medicine he's already on, and the fact that he's been doing well, I am hesitant to make any changes right now. The most recent value was not too far off from goal. I would first encourage him to reduce salt intake/DASH type diet. If blood pressure remains over 140 when he checks it, would have him f/u with primary care. Carvedilol would probably be the next step if needed.  Khaliah Barnick PA-C

## 2019-05-20 DIAGNOSIS — E876 Hypokalemia: Secondary | ICD-10-CM | POA: Diagnosis not present

## 2019-05-20 DIAGNOSIS — I10 Essential (primary) hypertension: Secondary | ICD-10-CM | POA: Diagnosis not present

## 2019-05-20 DIAGNOSIS — E1129 Type 2 diabetes mellitus with other diabetic kidney complication: Secondary | ICD-10-CM | POA: Diagnosis not present

## 2019-08-12 DIAGNOSIS — E876 Hypokalemia: Secondary | ICD-10-CM | POA: Diagnosis not present

## 2019-08-12 DIAGNOSIS — I10 Essential (primary) hypertension: Secondary | ICD-10-CM | POA: Diagnosis not present

## 2019-08-12 DIAGNOSIS — E1129 Type 2 diabetes mellitus with other diabetic kidney complication: Secondary | ICD-10-CM | POA: Diagnosis not present

## 2019-08-19 DIAGNOSIS — I700 Atherosclerosis of aorta: Secondary | ICD-10-CM | POA: Diagnosis not present

## 2019-08-19 DIAGNOSIS — Z6831 Body mass index (BMI) 31.0-31.9, adult: Secondary | ICD-10-CM | POA: Diagnosis not present

## 2019-08-19 DIAGNOSIS — I10 Essential (primary) hypertension: Secondary | ICD-10-CM | POA: Diagnosis not present

## 2019-08-19 DIAGNOSIS — E1129 Type 2 diabetes mellitus with other diabetic kidney complication: Secondary | ICD-10-CM | POA: Diagnosis not present

## 2019-09-05 DIAGNOSIS — I10 Essential (primary) hypertension: Secondary | ICD-10-CM | POA: Diagnosis not present

## 2019-09-05 DIAGNOSIS — E162 Hypoglycemia, unspecified: Secondary | ICD-10-CM | POA: Diagnosis not present

## 2019-10-03 DIAGNOSIS — H353132 Nonexudative age-related macular degeneration, bilateral, intermediate dry stage: Secondary | ICD-10-CM | POA: Diagnosis not present

## 2019-10-03 DIAGNOSIS — H43813 Vitreous degeneration, bilateral: Secondary | ICD-10-CM | POA: Diagnosis not present

## 2019-10-03 DIAGNOSIS — E119 Type 2 diabetes mellitus without complications: Secondary | ICD-10-CM | POA: Diagnosis not present

## 2019-10-03 DIAGNOSIS — Z961 Presence of intraocular lens: Secondary | ICD-10-CM | POA: Diagnosis not present

## 2019-10-08 DIAGNOSIS — Z0120 Encounter for dental examination and cleaning without abnormal findings: Secondary | ICD-10-CM | POA: Diagnosis not present

## 2019-11-14 DIAGNOSIS — E1129 Type 2 diabetes mellitus with other diabetic kidney complication: Secondary | ICD-10-CM | POA: Diagnosis not present

## 2019-11-14 DIAGNOSIS — Z79899 Other long term (current) drug therapy: Secondary | ICD-10-CM | POA: Diagnosis not present

## 2019-11-14 DIAGNOSIS — E876 Hypokalemia: Secondary | ICD-10-CM | POA: Diagnosis not present

## 2019-11-14 DIAGNOSIS — N183 Chronic kidney disease, stage 3 unspecified: Secondary | ICD-10-CM | POA: Diagnosis not present

## 2019-11-21 DIAGNOSIS — N1831 Chronic kidney disease, stage 3a: Secondary | ICD-10-CM | POA: Diagnosis not present

## 2019-11-21 DIAGNOSIS — I10 Essential (primary) hypertension: Secondary | ICD-10-CM | POA: Diagnosis not present

## 2019-11-21 DIAGNOSIS — E1122 Type 2 diabetes mellitus with diabetic chronic kidney disease: Secondary | ICD-10-CM | POA: Diagnosis not present

## 2019-12-16 ENCOUNTER — Observation Stay (HOSPITAL_COMMUNITY)
Admission: EM | Admit: 2019-12-16 | Discharge: 2019-12-17 | Disposition: A | Discharge status: Home / Self Care | Payer: Medicare Other | Attending: Internal Medicine | Admitting: Internal Medicine

## 2019-12-16 ENCOUNTER — Observation Stay (HOSPITAL_COMMUNITY): Payer: Medicare Other

## 2019-12-16 ENCOUNTER — Encounter (HOSPITAL_COMMUNITY): Payer: Self-pay

## 2019-12-16 ENCOUNTER — Emergency Department (HOSPITAL_COMMUNITY): Payer: Medicare Other

## 2019-12-16 ENCOUNTER — Other Ambulatory Visit: Payer: Self-pay

## 2019-12-16 DIAGNOSIS — R29818 Other symptoms and signs involving the nervous system: Secondary | ICD-10-CM | POA: Diagnosis present

## 2019-12-16 DIAGNOSIS — G459 Transient cerebral ischemic attack, unspecified: Principal | ICD-10-CM | POA: Insufficient documentation

## 2019-12-16 DIAGNOSIS — I2510 Atherosclerotic heart disease of native coronary artery without angina pectoris: Secondary | ICD-10-CM | POA: Insufficient documentation

## 2019-12-16 DIAGNOSIS — I129 Hypertensive chronic kidney disease with stage 1 through stage 4 chronic kidney disease, or unspecified chronic kidney disease: Secondary | ICD-10-CM | POA: Insufficient documentation

## 2019-12-16 DIAGNOSIS — G453 Amaurosis fugax: Secondary | ICD-10-CM | POA: Diagnosis not present

## 2019-12-16 DIAGNOSIS — N183 Chronic kidney disease, stage 3 unspecified: Secondary | ICD-10-CM | POA: Diagnosis not present

## 2019-12-16 DIAGNOSIS — E1122 Type 2 diabetes mellitus with diabetic chronic kidney disease: Secondary | ICD-10-CM | POA: Diagnosis not present

## 2019-12-16 DIAGNOSIS — Z79899 Other long term (current) drug therapy: Secondary | ICD-10-CM | POA: Insufficient documentation

## 2019-12-16 DIAGNOSIS — I6503 Occlusion and stenosis of bilateral vertebral arteries: Secondary | ICD-10-CM | POA: Diagnosis not present

## 2019-12-16 DIAGNOSIS — Z794 Long term (current) use of insulin: Secondary | ICD-10-CM | POA: Diagnosis not present

## 2019-12-16 DIAGNOSIS — H54511A Low vision right eye category 1, normal vision left eye: Secondary | ICD-10-CM | POA: Diagnosis present

## 2019-12-16 DIAGNOSIS — I6523 Occlusion and stenosis of bilateral carotid arteries: Secondary | ICD-10-CM | POA: Diagnosis not present

## 2019-12-16 DIAGNOSIS — I6603 Occlusion and stenosis of bilateral middle cerebral arteries: Secondary | ICD-10-CM | POA: Diagnosis not present

## 2019-12-16 DIAGNOSIS — R9431 Abnormal electrocardiogram [ECG] [EKG]: Secondary | ICD-10-CM | POA: Diagnosis not present

## 2019-12-16 DIAGNOSIS — G319 Degenerative disease of nervous system, unspecified: Secondary | ICD-10-CM | POA: Diagnosis not present

## 2019-12-16 DIAGNOSIS — Z7982 Long term (current) use of aspirin: Secondary | ICD-10-CM | POA: Insufficient documentation

## 2019-12-16 DIAGNOSIS — E782 Mixed hyperlipidemia: Secondary | ICD-10-CM | POA: Diagnosis present

## 2019-12-16 DIAGNOSIS — I10 Essential (primary) hypertension: Secondary | ICD-10-CM | POA: Diagnosis not present

## 2019-12-16 DIAGNOSIS — Z20822 Contact with and (suspected) exposure to covid-19: Secondary | ICD-10-CM | POA: Insufficient documentation

## 2019-12-16 DIAGNOSIS — R609 Edema, unspecified: Secondary | ICD-10-CM | POA: Diagnosis not present

## 2019-12-16 LAB — COMPREHENSIVE METABOLIC PANEL
ALT: 19 U/L (ref 0–44)
AST: 16 U/L (ref 15–41)
Albumin: 3.6 g/dL (ref 3.5–5.0)
Alkaline Phosphatase: 130 U/L — ABNORMAL HIGH (ref 38–126)
Anion gap: 9 (ref 5–15)
BUN: 24 mg/dL — ABNORMAL HIGH (ref 8–23)
CO2: 25 mmol/L (ref 22–32)
Calcium: 8.7 mg/dL — ABNORMAL LOW (ref 8.9–10.3)
Chloride: 98 mmol/L (ref 98–111)
Creatinine, Ser: 1.19 mg/dL (ref 0.61–1.24)
GFR calc Af Amer: >60 mL/min (ref >60)
GFR calc non Af Amer: 54 mL/min — ABNORMAL LOW (ref >60)
Glucose, Bld: 285 mg/dL — ABNORMAL HIGH (ref 70–99)
Potassium: 3.6 mmol/L (ref 3.5–5.1)
Sodium: 132 mmol/L — ABNORMAL LOW (ref 135–145)
Total Bilirubin: 0.6 mg/dL (ref 0.3–1.2)
Total Protein: 7.4 g/dL (ref 6.5–8.1)

## 2019-12-16 LAB — PROTIME-INR
INR: 1 (ref 0.8–1.2)
Prothrombin Time: 13.1 seconds (ref 11.4–15.2)

## 2019-12-16 LAB — DIFFERENTIAL
Abs Immature Granulocytes: 0.03 10*3/uL (ref 0.00–0.07)
Basophils Absolute: 0.1 10*3/uL (ref 0.0–0.1)
Basophils Relative: 1 %
Eosinophils Absolute: 0.3 10*3/uL (ref 0.0–0.5)
Eosinophils Relative: 2 %
Immature Granulocytes: 0 %
Lymphocytes Relative: 18 %
Lymphs Abs: 2.1 10*3/uL (ref 0.7–4.0)
Monocytes Absolute: 1.2 10*3/uL — ABNORMAL HIGH (ref 0.1–1.0)
Monocytes Relative: 10 %
Neutro Abs: 8.1 10*3/uL — ABNORMAL HIGH (ref 1.7–7.7)
Neutrophils Relative %: 69 %

## 2019-12-16 LAB — CBG MONITORING, ED: Glucose-Capillary: 247 mg/dL — ABNORMAL HIGH (ref 70–99)

## 2019-12-16 LAB — CBC
HCT: 37.5 % — ABNORMAL LOW (ref 39.0–52.0)
Hemoglobin: 12.6 g/dL — ABNORMAL LOW (ref 13.0–17.0)
MCH: 30.6 pg (ref 26.0–34.0)
MCHC: 33.6 g/dL (ref 30.0–36.0)
MCV: 91 fL (ref 80.0–100.0)
Platelets: 378 10*3/uL (ref 150–400)
RBC: 4.12 MIL/uL — ABNORMAL LOW (ref 4.22–5.81)
RDW: 12.9 % (ref 11.5–15.5)
WBC: 11.8 10*3/uL — ABNORMAL HIGH (ref 4.0–10.5)
nRBC: 0 % (ref 0.0–0.2)

## 2019-12-16 LAB — APTT: aPTT: 29 seconds (ref 24–36)

## 2019-12-16 LAB — SARS CORONAVIRUS 2 BY RT PCR (HOSPITAL ORDER, PERFORMED IN ~~LOC~~ HOSPITAL LAB): SARS Coronavirus 2: NEGATIVE

## 2019-12-16 MED ORDER — IOHEXOL 350 MG/ML SOLN
75.0000 mL | Freq: Once | INTRAVENOUS | Status: AC | PRN
  Administered 2019-12-16: 75 mL via INTRAVENOUS

## 2019-12-16 MED ORDER — ASPIRIN 81 MG PO CHEW
324.0000 mg | CHEWABLE_TABLET | Freq: Once | ORAL | Status: AC
  Administered 2019-12-16: 324 mg via ORAL
  Filled 2019-12-16: qty 4

## 2019-12-16 NOTE — ED Triage Notes (Signed)
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  States that he went to his eye doctor today and was told to come to the er for a ct to see if he had a blood clot in his eye or a "stroke in my eye".  Denies weakness anywhere else, denies slurred speech.

## 2019-12-16 NOTE — H&P (Signed)
TRH H&P   Patient Demographics:    Alexander Clayton, is a 86 y.o. male  MRN: [id number]   DOB - November 11, 1927  Admit Date - 12/16/2019  Outpatient Primary MD for the patient is Asencion Noble, MD  Referring MD/NP/PA: PA Harris  Patient coming from: ophthalmology office  Chief Complaint  Patient presents with   Eye Problem      HPI:    Alexander Clayton  is a 86 y.o. male, with past medical history of hypertension, hyperlipidemia, diabetes mellitus, insulin-dependent, who presents to ED secondary to complaints of blurry vision, patient reports symptoms started on Friday, where he had a transient episodes, as well he had another episode today, for which he went to see ophthalmologist Dr., His ophthalmologist sent him to ED for evaluation for ischemic event, patient denies any other focal deficits, no facial droop, loss of consciousness, no tingling, no numbness, he denies any vision loss, reports mainly blurry vision, he reports its not double vision as well, he denies any eye pain, reports blurry vision still pulsatile cyst to this point. - in ED patient was seen by teleneurologist, who recommended admission for CVA/TIA work-up, CTA head and neck significant for small vessel disease, patient was ordered full dose aspirin in ED, [facility] consulted to admit.    Review of systems:    In addition to the HPI above No Fever-chills, No Headache, patient reports blurry vision No problems swallowing food or Liquids, No Chest pain, Cough or Shortness of Breath, No Abdominal pain, No Nausea or Vommitting, Bowel movements are regular, No Blood in stool or Urine, No dysuria, No new skin rashes or bruises, No new joints pains-aches,  No new weakness, tingling, numbness in any extremity, No recent weight gain or loss, No polyuria, polydypsia or polyphagia, No significant Mental  Stressors.  A full 10 point Review of Systems was done, except as stated above, all other Review of Systems were negative.   With Past History of the following :    Past Medical History:  Diagnosis Date   Bladder cancer ([place])    CAD (coronary artery disease)    a. per coronary CTA 09/2018 - managed medically.   CKD (chronic kidney disease), stage III    Diabetes mellitus without complication (HCC)    Hepatic steatosis    by CT 08/2018   Hiatal hernia    small by CT 08/2018   Hypercholesteremia    Hypertension       Past Surgical History:  Procedure Laterality Date   APPENDECTOMY     HERNIA REPAIR        Social History:     Social History   Tobacco Use   Smoking status: Never Smoker   Smokeless tobacco: Never Used  Substance Use Topics   Alcohol use: No    Alcohol/week: 0.0 standard drinks      Family History :  Family History  Problem Relation Age of Onset   Hypertension Father       Home Medications:   Prior to Admission medications   Medication Sig Start Date End Date Taking? Authorizing Provider  acetaminophen (TYLENOL) 500 MG tablet Take 1,000 mg by mouth 3 (three) times daily.     [provider]  amLODipine (NORVASC) 10 MG tablet Take 10 mg by mouth every morning.     [provider]  aspirin EC 81 MG tablet Take 81 mg by mouth at bedtime.     [provider]  atorvastatin (LIPITOR) 20 MG tablet Take 20 mg by mouth at bedtime.     [provider]  cetirizine (ZYRTEC) 10 MG tablet Take 10 mg by mouth at bedtime.     [provider]  gabapentin (NEURONTIN) 300 MG capsule Take 300 mg by mouth 2 (two) times a day.    [provider]  glipiZIDE (GLUCOTROL) 5 MG tablet Take 5 mg by mouth every morning.     [provider]  indapamide (LOZOL) 2.5 MG tablet Take 2.5 mg by mouth every morning.     [provider]  insulin glargine (LANTUS) 100 UNIT/ML injection Inject 35  Units into the skin at bedtime.     [provider]  levothyroxine (SYNTHROID) 75 MCG tablet Take 75 mcg by mouth daily before breakfast.    [provider]  losartan (COZAAR) 100 MG tablet Take 100 mg by mouth every morning.    [provider]  metFORMIN (GLUCOPHAGE) 500 MG tablet Take 500 mg by mouth 2 (two) times daily.     [provider]  Multiple Vitamin (MULTIVITAMIN) capsule Take 1 capsule by mouth at bedtime.     [provider]  Multiple Vitamins-Minerals (ICAPS AREDS 2 PO) Take 1 tablet by mouth 2 (two) times a day.     [provider]  potassium chloride (KLOR-CON) 20 MEQ packet Take 20 mEq by mouth daily.    [provider]     Allergies:     Allergies  Allergen Reactions   Ciprofloxacin Hives and Rash   Vioxx [Rofecoxib] Hives and Rash    sweating     Physical Exam:   Vitals  Blood pressure (!) 146/53, pulse 73, temperature 98 F (36.7 C), temperature source Oral, height 5' 7.5" (1.715 m), weight 93 kg, SpO2 98 %.   1. General well developed male, laying in bed, no apparent distress  2. Normal affect and insight, Not Suicidal or Homicidal, Awake Alert, Oriented X 3.  3. No F.N deficits, ALL C.Nerves Intact, Strength 5/5 all 4 extremities, Sensation intact all 4 extremities, Plantars down going.  4. Ears and Eyes appear Normal, Conjunctivae clear, PERRLA. Moist Oral Mucosa, patient remains with blurry vision in the right side.  5. Supple Neck, No JVD, No cervical lymphadenopathy appriciated, No Carotid Bruits.  6. Symmetrical Chest wall movement, Good air movement bilaterally, CTAB.  7. RRR, No Gallops, Rubs or Murmurs, No Parasternal Heave.  8. Positive Bowel Sounds, Abdomen Soft, No tenderness, No organomegaly appriciated,No rebound -guarding or rigidity.  9.  No Cyanosis, Normal Skin Turgor, No Skin Rash or Bruise.  10. Good muscle tone,  joints appear normal , no effusions, Normal  ROM.  11. No Palpable Lymph Nodes in Neck or Axillae     Data Review:    CBC Recent Labs  Lab 12/16/19 1445  WBC 11.8*  HGB 12.6*  HCT 37.5*  PLT 378  MCV 91.0  MCH 30.6  MCHC 33.6  RDW 12.9  LYMPHSABS 2.1  MONOABS 1.2*  EOSABS 0.3  BASOSABS 0.1   ------------------------------------------------------------------------------------------------------------------  Chemistries  Recent Labs  Lab 12/16/19 1445  NA 132*  K 3.6  CL 98  CO2 25  GLUCOSE 285*  BUN 24*  CREATININE 1.19  CALCIUM 8.7*  AST 16  ALT 19  ALKPHOS 130*  BILITOT 0.6   ------------------------------------------------------------------------------------------------------------------ estimated creatinine clearance is 46.2 mL/min (by C-G formula based on SCr of 1.19 mg/dL). ------------------------------------------------------------------------------------------------------------------ No results for input(s): TSH, T4TOTAL, T3FREE, THYROIDAB in the last 72 hours.  Invalid input(s): FREET3  Coagulation profile Recent Labs  Lab 12/16/19 1445  INR 1.0   ------------------------------------------------------------------------------------------------------------------- No results for input(s): DDIMER in the last 72 hours. -------------------------------------------------------------------------------------------------------------------  Cardiac Enzymes No results for input(s): CKMB, TROPONINI, MYOGLOBIN in the last 168 hours.  Invalid input(s): CK ------------------------------------------------------------------------------------------------------------------ No results found for: BNP   ---------------------------------------------------------------------------------------------------------------  Urinalysis No results found for: COLORURINE, APPEARANCEUR, LABSPEC, [place], GLUCOSEU, HGBUR, BILIRUBINUR, KETONESUR, PROTEINUR, UROBILINOGEN, NITRITE,  LEUKOCYTESUR  ----------------------------------------------------------------------------------------------------------------   Imaging Results:    CT Angio Head W or Wo Contrast  Result Date: 12/16/2019 CLINICAL DATA:  86 year old male with 2 episodes of loss of vision in his right eye over the past 2-3 days. EXAM: CT ANGIOGRAPHY HEAD AND NECK TECHNIQUE: Multidetector CT imaging of the head and neck was performed using the standard protocol during bolus administration of intravenous contrast. Multiplanar CT image reconstructions and MIPs were obtained to evaluate the vascular anatomy. Carotid stenosis measurements (when applicable) are obtained utilizing NASCET criteria, using the distal internal carotid diameter as the denominator. CONTRAST:  44mL OMNIPAQUE IOHEXOL 350 MG/ML SOLN COMPARISON:  Chest CTA 09/04/2018. FINDINGS: CT HEAD Brain: Calcified atherosclerosis at the skull base. No midline shift, mass effect, or evidence of intracranial mass lesion. No ventriculomegaly. No acute intracranial hemorrhage identified. No cortically based acute infarct identified. No cortical encephalomalacia identified. Mild for age scattered bilateral white matter hypodensity. Calvarium and skull base: No acute osseous abnormality identified. Paranasal sinuses: Visualized paranasal sinuses and mastoids are clear. Orbits: Postoperative changes to both globes, otherwise negative orbits soft tissues. Visualized scalp soft tissues are within normal limits. CTA NECK Skeleton: No acute osseous abnormality identified. Chronic appearing T3 compression fracture, as well as mild T1 and T2 superior endplate compression. Chronic left side cervical facet arthropathy. Upper chest: Partially calcified right apical lung scarring. Suggestion of a small layering right pleural effusion. Otherwise negative. Other neck: Atrophied right submandibular gland.  No acute findings. Aortic arch: Calcified aortic atherosclerosis. 3 vessel arch  configuration. Right carotid system: Mildly tortuous brachiocephalic artery without stenosis despite plaque. Negative right CCA origin. Minimal carotid plaque proximal to the bifurcation. Soft and calcified plaque at the right ICA origin, mostly calcified plaque at the distal bulb. No stenosis to the skull base. Left carotid system: No left CCA origin stenosis despite some plaque. Tortuous proximal left CCA. Minimal additional plaque before the bifurcation. Only mild calcified proximal left ICA plaque without stenosis. Vertebral arteries: Tortuous proximal right subclavian artery with a kinked appearance at the thoracic inlet (series 9, image 314), but minimal atherosclerosis. However, there is bulky calcified plaque at the right vertebral artery origin resulting in mild to moderate stenosis (series 10, image 181). The right vertebral then is patent and normal to the skull base. Soft and calcified plaque of the proximal left subclavian artery without stenosis. Bulky calcified plaque at the left ICA origin but only mild stenosis (series 9, image  283 and series 10, image 180). Tortuous left V1 segment. Codominant vertebral arteries, the left is patent to the skull base without additional plaque or stenosis. CTA HEAD Posterior circulation: The distal left vertebral artery is mildly dominant with left V4 calcified plaque resulting in mild stenosis upstream of the normal left PICA origin. Non dominant right V4 segment is normal to the vertebrobasilar junction with normal PICA. Patent basilar artery without stenosis. SCA origins are normal. There are fetal type bilateral PCA origins with tortuous posterior communicating arteries. There is a tortuous right P1. Bilateral P1 and P2 segments are within normal limits. No definite PCA branch occlusion identified. Anterior circulation: Both ICA siphons are patent. On the left there is mild calcified plaque without significant stenosis. Left ophthalmic and posterior communicating  artery origins are normal. Normal right carotid terminus. On the right there is mild to moderate calcified plaque with only mild stenosis at the anterior genu. Both the right ophthalmic and posterior communicating artery origins appear normal (with fairly symmetric appearing ophthalmic artery enhancement on series 9, image 122). Normal right ICA terminus. Normal MCA and ACA origins. Normal anterior communicating artery. Bilateral ACA branches are within normal limits. Left MCA M1 segment and bifurcation are patent without stenosis. Right MCA M1 segment and bifurcation are patent without stenosis. Bilateral MCA branches are within normal limits. Venous sinuses: Patent. Anatomic variants: Fetal type PCA origins more so the left. Mildly dominant distal left vertebral artery. Review of the MIP images confirms the above findings IMPRESSION: 1. Negative for large vessel occlusion. Right greater than left carotid atherosclerosis but no hemodynamically significant carotid stenosis. The Right Ophthalmic Artery appears within normal limits. 2. Up to moderate stenosis of the Right Vertebral Artery origin due to calcified plaque. Other calcified plaque without significant stenosis. Aortic Atherosclerosis (ICD10-I70.0). 3. No acute intracranial abnormality by CT. Mild for age nonspecific cerebral white matter changes. 4. Small layering right pleural effusion suspected. 5. Chronic appearing upper thoracic compression fractures. Electronically Signed   By: Genevie Ann M.D.   On: 12/16/2019 16:55   CT Angio Neck W and/or Wo Contrast  Result Date: 12/16/2019 CLINICAL DATA:  86 year old male with 2 episodes of loss of vision in his right eye over the past 2-3 days. EXAM: CT ANGIOGRAPHY HEAD AND NECK TECHNIQUE: Multidetector CT imaging of the head and neck was performed using the standard protocol during bolus administration of intravenous contrast. Multiplanar CT image reconstructions and MIPs were obtained to evaluate the vascular  anatomy. Carotid stenosis measurements (when applicable) are obtained utilizing NASCET criteria, using the distal internal carotid diameter as the denominator. CONTRAST:  45mL OMNIPAQUE IOHEXOL 350 MG/ML SOLN COMPARISON:  Chest CTA 09/04/2018. FINDINGS: CT HEAD Brain: Calcified atherosclerosis at the skull base. No midline shift, mass effect, or evidence of intracranial mass lesion. No ventriculomegaly. No acute intracranial hemorrhage identified. No cortically based acute infarct identified. No cortical encephalomalacia identified. Mild for age scattered bilateral white matter hypodensity. Calvarium and skull base: No acute osseous abnormality identified. Paranasal sinuses: Visualized paranasal sinuses and mastoids are clear. Orbits: Postoperative changes to both globes, otherwise negative orbits soft tissues. Visualized scalp soft tissues are within normal limits. CTA NECK Skeleton: No acute osseous abnormality identified. Chronic appearing T3 compression fracture, as well as mild T1 and T2 superior endplate compression. Chronic left side cervical facet arthropathy. Upper chest: Partially calcified right apical lung scarring. Suggestion of a small layering right pleural effusion. Otherwise negative. Other neck: Atrophied right submandibular gland.  No acute findings. Aortic  arch: Calcified aortic atherosclerosis. 3 vessel arch configuration. Right carotid system: Mildly tortuous brachiocephalic artery without stenosis despite plaque. Negative right CCA origin. Minimal carotid plaque proximal to the bifurcation. Soft and calcified plaque at the right ICA origin, mostly calcified plaque at the distal bulb. No stenosis to the skull base. Left carotid system: No left CCA origin stenosis despite some plaque. Tortuous proximal left CCA. Minimal additional plaque before the bifurcation. Only mild calcified proximal left ICA plaque without stenosis. Vertebral arteries: Tortuous proximal right subclavian artery with a  kinked appearance at the thoracic inlet (series 9, image 314), but minimal atherosclerosis. However, there is bulky calcified plaque at the right vertebral artery origin resulting in mild to moderate stenosis (series 10, image 181). The right vertebral then is patent and normal to the skull base. Soft and calcified plaque of the proximal left subclavian artery without stenosis. Bulky calcified plaque at the left ICA origin but only mild stenosis (series 9, image 283 and series 10, image 180). Tortuous left V1 segment. Codominant vertebral arteries, the left is patent to the skull base without additional plaque or stenosis. CTA HEAD Posterior circulation: The distal left vertebral artery is mildly dominant with left V4 calcified plaque resulting in mild stenosis upstream of the normal left PICA origin. Non dominant right V4 segment is normal to the vertebrobasilar junction with normal PICA. Patent basilar artery without stenosis. SCA origins are normal. There are fetal type bilateral PCA origins with tortuous posterior communicating arteries. There is a tortuous right P1. Bilateral P1 and P2 segments are within normal limits. No definite PCA branch occlusion identified. Anterior circulation: Both ICA siphons are patent. On the left there is mild calcified plaque without significant stenosis. Left ophthalmic and posterior communicating artery origins are normal. Normal right carotid terminus. On the right there is mild to moderate calcified plaque with only mild stenosis at the anterior genu. Both the right ophthalmic and posterior communicating artery origins appear normal (with fairly symmetric appearing ophthalmic artery enhancement on series 9, image 122). Normal right ICA terminus. Normal MCA and ACA origins. Normal anterior communicating artery. Bilateral ACA branches are within normal limits. Left MCA M1 segment and bifurcation are patent without stenosis. Right MCA M1 segment and bifurcation are patent without  stenosis. Bilateral MCA branches are within normal limits. Venous sinuses: Patent. Anatomic variants: Fetal type PCA origins more so the left. Mildly dominant distal left vertebral artery. Review of the MIP images confirms the above findings IMPRESSION: 1. Negative for large vessel occlusion. Right greater than left carotid atherosclerosis but no hemodynamically significant carotid stenosis. The Right Ophthalmic Artery appears within normal limits. 2. Up to moderate stenosis of the Right Vertebral Artery origin due to calcified plaque. Other calcified plaque without significant stenosis. Aortic Atherosclerosis (ICD10-I70.0). 3. No acute intracranial abnormality by CT. Mild for age nonspecific cerebral white matter changes. 4. Small layering right pleural effusion suspected. 5. Chronic appearing upper thoracic compression fractures. Electronically Signed   By: Genevie Ann M.D.   On: 12/16/2019 16:55    My personal review of EKG: Rhythm NSR, Rate  59 /min, QTc435   Assessment & Plan:    Active Problems:   CKD (chronic kidney disease) stage 3, GFR 30-59 ml/min   Essential hypertension   Mixed hyperlipidemia   Focal neurological deficit    Right eye blurry vision: -Concern is for ischemic event, the neurology input greatly appreciated, she will be admitted under TIA/CVA pathway, CTA head and neck done, with evidence of small  vessel disease, will give full dose aspirin for now, will check A1c and lipid panel. -We will obtain 2D echo, MRI brain without contrast prior to neurology recommendation. -We will consult neurology service. -Consult PT/OT/SLP.  Hypertension -Continue with home medications, he is due for his medications tomorrow morning, he is already having 3 days of symptoms, so cannot tolerate normalizing blood pressure at this point.  Hyperlipidemia -Check lipid panel. -Continue with statin.  CKd stage III -Renal function at baseline  Be[place]ies mellitus, insulin-dependent -Check A1c,  continue with home glipizide and Lantus, will hold Metformin, and add insulin sliding scale  Hypothyroidism -Continue with Synthroid    DVT Prophylaxis Heparin -  Lovenox - SCDs  AM Labs Ordered, also please review Full Orders  Family Communication: Admission, patients condition and plan of care including tests being ordered have been discussed with the patient  who indicate understanding and agree with the plan and Code Status.  Code Status  Full  Likely DC to  home  Condition GUARDED    Consults called:   Neurology placed in Epic.  Admission status:  Observation  Time spent in minutes : 60 minutes   Phillips Climes M.D on 12/16/2019 at 5:29 PM   [facility] - Office  [phone]

## 2019-12-16 NOTE — ED Provider Notes (Signed)
Medical screening examination/treatment/procedure(s) were conducted as a shared visit with non-physician practitioner(s) and myself.  I personally evaluated the patient during the encounter.  Clinical Impression:   Final diagnoses:  TIA (transient ischemic attack)    This patient is an 86 year old male presenting from the ophthalmology office with a complaint of decreased vision in his right eye.  He states that this happened a couple of times over the weekend, he has been slightly decreased in that eye since that time and after being seen today in the ophthalmology office was redirected to the emergency department for evaluation for potential TIA versus stroke in his right eye.  On my exam the patient states that he has slight decreased vision bilaterally, he has dilated pupils bilaterally, I do not notice any definite decreases in perfusion on his retina, no blood in thunder, no pale retina, no cherry spot.  His vasculature is seen in the eyes bilaterally.  He has no obvious facial droop slurred speech or weakness in his 4 extremities.  The patient would benefit from the rest of the stroke work-up, consultation with neurology.   EKG Interpretation  Date/Time:  Monday December 16 2019 14:38:03 EDT Ventricular Rate:  59 PR Interval:    QRS Duration: 103 QT Interval:  439 QTC Calculation: 435 R Axis:   85 Text Interpretation: Sinus rhythm Borderline right axis deviation Nonspecific T abnrm, anterolateral leads Confirmed by Noemi Chapel [phone]) on 12/16/2019 3:09:27 PM          Noemi Chapel, MD 12/17/19 [phone]

## 2019-12-16 NOTE — Consult Note (Signed)
[facility] Consult Services  Stat Consult  Date of Service:   12/16/2019 15:37:26  Impression:     .  H53.8 - Blurred Vision  Comments/Sign-Out: 86 year old male with history of diabetes, hypertension, migraine with visual aura in the past, presents today to the emergency department for concern for right eye visual disturbance that started about 3 days ago. He was sent by an ophthalmologist to the emergency department due to the concern for possible vascular compromise, although it is unclear whether or not a fluorescein angiogram was performed at that time he was seen by ophthalmologist earlier today. His examination currently is nonfocal. Differential includes a branch retinal artery occlusion, central retinal artery occlusion, retinal migraine, anterior ischemic optic neuropathy. No eye pain to suggest optic neuritis. Recommend admission for stroke work-up including a CT head as well as CT angiogram head and neck, as well as consideration for a brain MRI scan without contrast. TTE. Continue aspirin. Depending on his work-up, consider whether or not he needs any further neurovascular imaging or evaluations. Neurology follow-up.  Metrics: [facility] Notification Time: 12/16/2019 15:35:27 Stamp Time: 12/16/2019 15:37:26 Callback Response Time: 12/16/2019 15:38:52  Our recommendations are outlined below.  Recommendations:     Marland Kitchen  MRI brain without IV contrast     .  Continue daily aspirin     .  TSH, A1c, lipid profile     .  Transthoracic echocardiogram     .  Continuous telemetry     .  Physical, occupational, and speech therapies     .  q4h neuro checks/NIHSS     .  NPO until bedside swallow     .  Neurology follow-up    Sign Out:     .  Discussed with Emergency Department Provider  ----------------------------------------------------------------------------------------------------  Chief Complaint: Transient visual  difficulties  History of Present Illness: Patient is a 86 year old Male.  This is an 87 year old male with a history of diabetes, hypertension who was sent by an ophthalmologist today due to the concern for right eye vision problems. Symptoms started about 3 days ago. At around 5:30 PM on Friday, he suddenly could not see out of his right eye and his vision went "right". Not dark. This happened abruptly. Symptoms persisted for about 30 minutes an hour, and then slowly improved however he continued to have blurred vision which persisted in the right eye. He then had a similar event yesterday which also resolved. Currently, continues to have blurred vision in the right eye. No history of strokes or TIAs. No eye pain. He is never had the symptoms before. He was seen by an ophthalmologist earlier today. Unclear whether or not he had a fluorescein angiogram although does sound like he did have a dilated exam, at which point they were concerned for possible retinal vascular events and therefore sent him to the emergency department for concern for stroke/TIA. Currently, he feels at his baseline other than the right eye blurred vision. He denies any clear vision loss in the right eye. He does have a history of migraine with visual aura in the past although has not had a migraine in many years. He denies any headaches currently.    Past Medical History:     . Hypertension     . Diabetes Mellitus  Anticoagulant use:  No  Antiplatelet use: asa 81     Examination: BP(151/63), Pulse(58), 1A: Level of Consciousness - Alert; keenly responsive + 0 1B: Ask Month and Age - Both  Questions Right + 0 1C: Blink Eyes & Squeeze Hands - Performs Both Tasks + 0 2: Test Horizontal Extraocular Movements - Normal + 0 3: Test Visual Fields - No Visual Loss + 0 4: Test Facial Palsy (Use Grimace if Obtunded) - Normal symmetry + 0 5A: Test Left Arm Motor Drift - No Drift for 10 Seconds + 0 5B: Test Right Arm Motor Drift -  No Drift for 10 Seconds + 0 6A: Test Left Leg Motor Drift - No Drift for 5 Seconds + 0 6B: Test Right Leg Motor Drift - No Drift for 5 Seconds + 0 7: Test Limb Ataxia (FNF/Heel-Shin) - No Ataxia + 0 8: Test Sensation - Normal; No sensory loss + 0 9: Test Language/Aphasia - Normal; No aphasia + 0 10: Test Dysarthria - Normal + 0 11: Test Extinction/Inattention - No abnormality + 0  NIHSS Score: 0    Patient is being evaluated for possible acute neurologic impairment and high probability of imminent or life-threatening deterioration. I spent total of 35 minutes providing care to this patient, including time for face to face visit via telemedicine, review of medical records, imaging studies and discussion of findings with providers, the patient and/or family.   Dr Knox Royalty   [facility] [phone]  Case [id number]

## 2019-12-16 NOTE — ED Notes (Signed)
Called [facility] for PA.

## 2019-12-16 NOTE — ED Provider Notes (Signed)
[facility] EMERGENCY DEPARTMENT Provider Note   CSN: [id number] Arrival date & time: 12/16/19  1401     History Chief Complaint  Patient presents with  . Eye Problem    Alexander Clayton is a 86 y.o. male.  Who presents emergency department for symptoms of right eye vision loss.  Patient was sent in from his ophthalmologist who did an evaluation of the eye and states that there was no eye issues and sent him in for evaluation of amaurosis fugax symptoms.  Patient states that on Friday he had approximately 30 minutes of vision loss in the right eye where he could only see light followed by total vision loss that eye which resolved.  Since that time he has no vision issues.  He has a past medical history of chronic kidney disease, hypertension, hyperlipidemia, and diabetes.   HPI     Past Medical History:  Diagnosis Date  . Bladder cancer ([place])   . CAD (coronary artery disease)    a. per coronary CTA 09/2018 - managed medically.  . CKD (chronic kidney disease), stage III   . Diabetes mellitus without complication ([place])   . Hepatic steatosis    by CT 08/2018  . Hiatal hernia    small by CT 08/2018  . Hypercholesteremia   . Hypertension     Patient Active Problem List   Diagnosis Date Noted  . CKD (chronic kidney disease) stage 3, GFR 30-59 ml/min 09/04/2018  . Essential hypertension 09/04/2018  . Mixed hyperlipidemia 09/04/2018  . Chest pain 09/03/2018    Past Surgical History:  Procedure Laterality Date  . APPENDECTOMY    . HERNIA REPAIR         Family History  Problem Relation Age of Onset  . Hypertension Father     Social History   Tobacco Use  . Smoking status: Never Smoker  . Smokeless tobacco: Never Used  Vaping Use  . Vaping Use: Never used  Substance Use Topics  . Alcohol use: No    Alcohol/week: 0.0 standard drinks  . Drug use: No    Home Medications Prior to Admission medications   Medication Sig Start Date End Date Taking? Authorizing  Provider  acetaminophen (TYLENOL) 500 MG tablet Take 1,000 mg by mouth 3 (three) times daily.     [provider]  amLODipine (NORVASC) 10 MG tablet Take 10 mg by mouth every morning.     [provider]  aspirin EC 81 MG tablet Take 81 mg by mouth at bedtime.     [provider]  atorvastatin (LIPITOR) 20 MG tablet Take 20 mg by mouth at bedtime.     [provider]  cetirizine (ZYRTEC) 10 MG tablet Take 10 mg by mouth at bedtime.     [provider]  gabapentin (NEURONTIN) 300 MG capsule Take 300 mg by mouth 2 (two) times a day.    [provider]  glipiZIDE (GLUCOTROL) 5 MG tablet Take 5 mg by mouth every morning.     [provider]  indapamide (LOZOL) 2.5 MG tablet Take 2.5 mg by mouth every morning.     [provider]  insulin glargine (LANTUS) 100 UNIT/ML injection Inject 35 Units into the skin at bedtime.     [provider]  levothyroxine (SYNTHROID) 75 MCG tablet Take 75 mcg by mouth daily before breakfast.    [provider]  losartan (COZAAR) 100 MG tablet Take 100 mg by mouth every morning.  [provider]  metFORMIN (GLUCOPHAGE) 500 MG tablet Take 500 mg by mouth 2 (two) times daily.     [provider]  Multiple Vitamin (MULTIVITAMIN) capsule Take 1 capsule by mouth at bedtime.     [provider]  Multiple Vitamins-Minerals (ICAPS AREDS 2 PO) Take 1 tablet by mouth 2 (two) times a day.     [provider]  potassium chloride (KLOR-CON) 20 MEQ packet Take 20 mEq by mouth daily.    [provider]    Allergies    Ciprofloxacin and Vioxx [rofecoxib]  Review of Systems   Review of Systems  Physical Exam Updated Vital Signs BP (!) 146/53 (BP Location: Right Arm)   Pulse 73   Temp 98 F (36.7 C) (Oral)   Ht 5' 7.5" (1.715 m)   Wt 93 kg   SpO2 98%   BMI 31.63 kg/m   Physical Exam  ED Results / Procedures / Treatments   Labs (all  labs ordered are listed, but only abnormal results are displayed) Labs Reviewed  CBC - Abnormal; Notable for the following components:      Result Value   WBC 11.8 (*)    RBC 4.12 (*)    Hemoglobin 12.6 (*)    HCT 37.5 (*)    All other components within normal limits  DIFFERENTIAL - Abnormal; Notable for the following components:   Neutro Abs 8.1 (*)    Monocytes Absolute 1.2 (*)    All other components within normal limits  COMPREHENSIVE METABOLIC PANEL - Abnormal; Notable for the following components:   Sodium 132 (*)    Glucose, Bld 285 (*)    BUN 24 (*)    Calcium 8.7 (*)    Alkaline Phosphatase 130 (*)    GFR calc non Af Amer 54 (*)    All other components within normal limits  PROTIME-INR  APTT  CBG MONITORING, ED    EKG EKG Interpretation  Date/Time:  Monday December 16 2019 14:38:03 EDT Ventricular Rate:  59 PR Interval:    QRS Duration: 103 QT Interval:  439 QTC Calculation: 435 R Axis:   85 Text Interpretation: Sinus rhythm Borderline right axis deviation Nonspecific T abnrm, anterolateral leads Confirmed by Noemi Chapel [phone]) on 12/16/2019 3:09:27 PM   Radiology No results found.  Procedures Procedures (including critical care time)  Medications Ordered in ED Medications - No data to display  ED Course  I have reviewed the triage vital signs and the nursing notes.  Pertinent labs & imaging results that were available during my care of the patient were reviewed by me and considered in my medical decision making (see chart for details).  Clinical Course as of Dec 15 1800  Mon Dec 16, 2019  1656 ?ocular migraine Hx of same Interischimic  Hx of migraine   [AH]    Clinical Course User Index [AH] Ned Grace   MDM Rules/Calculators/A&P                          86 year old male sent in for visual disturbance.  Differential diagnosis includes CRAO,CRVO, AION, less likely optic neuritis ans patient is pain free. I ordered interpreted and  reviewed labs which include CMP which shows elevated blood glucose, mild hyponatremia in that setting.  Slightly elevated alkaline phosphatase.  CBC with elevated white blood cell count of 11.8, mild anemia of insignificant value.  APTT and PT/INR within normal limits.  Covid test is  pending.  I ordered CT angiogram of the head and neck which shows significant arterial stenosis however no acute occlusions or evidence of stroke.  I discussed the case with the teleneurologist who recommends further evaluation with MRI and admission for TIA work-up.  I discussed the case with Dr. Waldron Labs who will admit the patient.  EKG shows sinus rhythm at a rate of 59 without evidence of acute ischemia or arrhythmia. Patient will be admitted to the hospitalist service.   Final Clinical Impression(s) / ED Diagnoses Final diagnoses:  None    Rx / DC Orders ED Discharge Orders    None       Margarita Mail, PA-C 12/16/19 1810    Noemi Chapel, MD 12/17/19 [phone]

## 2019-12-17 ENCOUNTER — Observation Stay (HOSPITAL_BASED_OUTPATIENT_CLINIC_OR_DEPARTMENT_OTHER): Payer: Medicare Other

## 2019-12-17 DIAGNOSIS — N183 Chronic kidney disease, stage 3 unspecified: Secondary | ICD-10-CM | POA: Diagnosis not present

## 2019-12-17 DIAGNOSIS — G459 Transient cerebral ischemic attack, unspecified: Secondary | ICD-10-CM | POA: Diagnosis not present

## 2019-12-17 DIAGNOSIS — E782 Mixed hyperlipidemia: Secondary | ICD-10-CM | POA: Diagnosis not present

## 2019-12-17 DIAGNOSIS — R29818 Other symptoms and signs involving the nervous system: Secondary | ICD-10-CM | POA: Diagnosis not present

## 2019-12-17 DIAGNOSIS — I10 Essential (primary) hypertension: Secondary | ICD-10-CM | POA: Diagnosis not present

## 2019-12-17 LAB — CBG MONITORING, ED
Glucose-Capillary: 119 mg/dL — ABNORMAL HIGH (ref 70–99)
Glucose-Capillary: 201 mg/dL — ABNORMAL HIGH (ref 70–99)

## 2019-12-17 LAB — ECHOCARDIOGRAM COMPLETE
AR max vel: 1.6 cm2
AV Area VTI: 1.88 cm2
AV Area mean vel: 1.59 cm2
AV Mean grad: 4.8 mmHg
AV Peak grad: 9.8 mmHg
Ao pk vel: 1.56 m/s
Area-P 1/2: 3.03 cm2
Height: 67.5 in
S' Lateral: 2.14 cm
Weight: 3192.26 oz

## 2019-12-17 LAB — LIPID PANEL
Cholesterol: 117 mg/dL (ref 0–200)
HDL: 31 mg/dL — ABNORMAL LOW (ref >40)
LDL Cholesterol: 56 mg/dL (ref 0–99)
Total CHOL/HDL Ratio: 3.8 RATIO
Triglycerides: 150 mg/dL — ABNORMAL HIGH (ref <150)
VLDL: 30 mg/dL (ref 0–40)

## 2019-12-17 LAB — HEMOGLOBIN A1C
Hgb A1c MFr Bld: 8.4 % — ABNORMAL HIGH (ref 4.8–5.6)
Mean Plasma Glucose: 194 mg/dL

## 2019-12-17 LAB — GLUCOSE, CAPILLARY: Glucose-Capillary: 254 mg/dL — ABNORMAL HIGH (ref 70–99)

## 2019-12-17 MED ORDER — HEPARIN SODIUM (PORCINE) 5000 UNIT/ML IJ SOLN
5000.0000 [IU] | Freq: Three times a day (TID) | INTRAMUSCULAR | Status: DC
  Administered 2019-12-17: 5000 [IU] via SUBCUTANEOUS
  Filled 2019-12-17: qty 1

## 2019-12-17 MED ORDER — INSULIN ASPART 100 UNIT/ML ~~LOC~~ SOLN
0.0000 [IU] | Freq: Three times a day (TID) | SUBCUTANEOUS | Status: DC
  Administered 2019-12-17: 5 [IU] via SUBCUTANEOUS

## 2019-12-17 MED ORDER — ACETAMINOPHEN 160 MG/5ML PO SOLN: 650.0000 mg | ORAL | Status: DC | PRN

## 2019-12-17 MED ORDER — ATORVASTATIN CALCIUM 20 MG PO TABS
20.0000 mg | ORAL_TABLET | Freq: Every day | ORAL | Status: DC
  Administered 2019-12-17: 20 mg via ORAL
  Filled 2019-12-17: qty 2

## 2019-12-17 MED ORDER — ADULT MULTIVITAMIN W/MINERALS CH
1.0000 | ORAL_TABLET | Freq: Every day | ORAL | Status: DC
  Administered 2019-12-17: 1 via ORAL
  Filled 2019-12-17: qty 1

## 2019-12-17 MED ORDER — AMLODIPINE BESYLATE 5 MG PO TABS
10.0000 mg | ORAL_TABLET | Freq: Every morning | ORAL | Status: DC
  Administered 2019-12-17: 10 mg via ORAL
  Filled 2019-12-17: qty 2

## 2019-12-17 MED ORDER — LOSARTAN POTASSIUM 50 MG PO TABS
100.0000 mg | ORAL_TABLET | Freq: Every morning | ORAL | Status: DC
  Administered 2019-12-17: 100 mg via ORAL
  Filled 2019-12-17: qty 2

## 2019-12-17 MED ORDER — GLIPIZIDE 5 MG PO TABS
5.0000 mg | ORAL_TABLET | Freq: Every day | ORAL | Status: DC
  Administered 2019-12-17: 5 mg via ORAL
  Filled 2019-12-17 (×3): qty 1

## 2019-12-17 MED ORDER — INDAPAMIDE 2.5 MG PO TABS
2.5000 mg | ORAL_TABLET | Freq: Every morning | ORAL | Status: DC
  Administered 2019-12-17: 2.5 mg via ORAL
  Filled 2019-12-17 (×3): qty 1

## 2019-12-17 MED ORDER — LEVOTHYROXINE SODIUM 75 MCG PO TABS
75.0000 ug | ORAL_TABLET | Freq: Every day | ORAL | Status: DC
  Administered 2019-12-17: 75 ug via ORAL
  Filled 2019-12-17: qty 2

## 2019-12-17 MED ORDER — GABAPENTIN 300 MG PO CAPS
300.0000 mg | ORAL_CAPSULE | Freq: Every day | ORAL | Status: DC
  Administered 2019-12-17: 300 mg via ORAL
  Filled 2019-12-17: qty 1

## 2019-12-17 MED ORDER — ACETAMINOPHEN 325 MG PO TABS: 650.0000 mg | ORAL_TABLET | ORAL | Status: DC | PRN

## 2019-12-17 MED ORDER — ASPIRIN EC 81 MG PO TBEC: 81.0000 mg | DELAYED_RELEASE_TABLET | Freq: Every day | ORAL | Status: DC

## 2019-12-17 MED ORDER — ACETAMINOPHEN 650 MG RE SUPP: 650.0000 mg | RECTAL | Status: DC | PRN

## 2019-12-17 MED ORDER — ACETAMINOPHEN 500 MG PO TABS
1000.0000 mg | ORAL_TABLET | Freq: Three times a day (TID) | ORAL | Status: DC
  Administered 2019-12-17: 1000 mg via ORAL
  Filled 2019-12-17: qty 2

## 2019-12-17 MED ORDER — STROKE: EARLY STAGES OF RECOVERY BOOK
Freq: Once | Status: DC
  Filled 2019-12-17: qty 1

## 2019-12-17 MED ORDER — INSULIN GLARGINE 100 UNIT/ML ~~LOC~~ SOLN
35.0000 [IU] | Freq: Every day | SUBCUTANEOUS | Status: DC
  Administered 2019-12-17: 35 [IU] via SUBCUTANEOUS
  Filled 2019-12-17 (×2): qty 0.35

## 2019-12-17 MED ORDER — POTASSIUM CHLORIDE 20 MEQ PO PACK
20.0000 meq | PACK | Freq: Every day | ORAL | Status: DC
  Administered 2019-12-17: 20 meq via ORAL
  Filled 2019-12-17: qty 1

## 2019-12-17 MED ORDER — SENNOSIDES-DOCUSATE SODIUM 8.6-50 MG PO TABS
1.0000 | ORAL_TABLET | Freq: Every evening | ORAL | Status: DC | PRN
  Filled 2019-12-17: qty 1

## 2019-12-17 MED ORDER — INSULIN GLARGINE 100 UNIT/ML ~~LOC~~ SOLN
35.0000 [IU] | Freq: Every day | SUBCUTANEOUS | Status: DC
  Filled 2019-12-17: qty 0.35

## 2019-12-17 MED ORDER — INSULIN ASPART 100 UNIT/ML ~~LOC~~ SOLN
0.0000 [IU] | Freq: Every day | SUBCUTANEOUS | Status: DC
  Administered 2019-12-17: 2 [IU] via SUBCUTANEOUS
  Filled 2019-12-17: qty 1

## 2019-12-17 NOTE — ED Notes (Signed)
ED TO INPATIENT HANDOFF REPORT  ED Nurse Name and Phone #:  [phone]  S Name/Age/Gender Alexander Clayton 86 y.o. male Room/Bed: APAH6/APAH6  Code Status   Code Status: Full Code  Home/SNF/Other Home Patient oriented to: self, place, time and situation Is this baseline? Yes   Triage Complete: Triage complete  Chief Complaint Focal neurological deficit [R29.818]  Triage Note Pt states that 530pm Friday, 300 pm Sunday he went blind in his R eye, states that all he could see was light.  States that he went to his eye doctor today and was told to come to the er for a ct to see if he had a blood clot in his eye or a "stroke in my eye".  Denies weakness anywhere else, denies slurred speech.      Allergies Allergies  Allergen Reactions  . Ciprofloxacin Hives and Rash  . Vioxx [Rofecoxib] Hives and Rash    sweating    Level of Care/Admitting Diagnosis ED Disposition    ED Disposition Condition [place] [facility] Area: [facility] [[id number]]  Level of Care: Telemetry [5]  Covid Evaluation: Asymptomatic Screening Protocol (No Symptoms)  Diagnosis: Focal neurological deficit [361443]  Admitting Physician: Manfred Shirts  Attending Physician: Waldron Labs, DAWOOD S [[id number]       B Medical/Surgery History Past Medical History:  Diagnosis Date  . Bladder cancer ([place])   . CAD (coronary artery disease)    a. per coronary CTA 09/2018 - managed medically.  . CKD (chronic kidney disease), stage III   . Diabetes mellitus without complication ([place])   . Hepatic steatosis    by CT 08/2018  . Hiatal hernia    small by CT 08/2018  . Hypercholesteremia   . Hypertension    Past Surgical History:  Procedure Laterality Date  . APPENDECTOMY    . HERNIA REPAIR       A IV Location/Drains/Wounds Patient Lines/Drains/Airways Status    Active Line/Drains/Airways    Name Placement date Placement time Site Days   Peripheral IV 12/16/19 Right Antecubital  12/16/19  1445  Antecubital  1          Intake/Output Last 24 hours No intake or output data in the 24 hours ending 12/17/19 1540  Labs/Imaging Results for orders placed or performed during the hospital encounter of 12/16/19 (from the past 48 hour(s))  Protime-INR     Status: None   Collection Time: 12/16/19  2:45 PM  Result Value Ref Range   Prothrombin Time 13.1 11.4 - 15.2 seconds   INR 1.0 0.8 - 1.2    Comment: (NOTE) INR goal varies based on device and disease states. Performed at [facility], [address], [place], [place] [address]   APTT     Status: None   Collection Time: 12/16/19  2:45 PM  Result Value Ref Range   aPTT 29 24 - 36 seconds    Comment: Performed at [facility], [address], [place], [place] [address]  CBC     Status: Abnormal   Collection Time: 12/16/19  2:45 PM  Result Value Ref Range   WBC 11.8 (H) 4.0 - 10.5 K/uL   RBC 4.12 (L) 4.22 - 5.81 MIL/uL   Hemoglobin 12.6 (L) 13.0 - 17.0 g/dL   HCT 37.5 (L) 39 - 52 %   MCV 91.0 80.0 - 100.0 fL   MCH 30.6 26.0 - 34.0 pg   MCHC 33.6 30.0 - 36.0 g/dL   RDW  12.9 11.5 - 15.5 %   Platelets 378 150 - 400 K/uL   nRBC 0.0 0.0 - 0.2 %    Comment: Performed at [facility], [address] [place], [place] [address]  Differential     Status: Abnormal   Collection Time: 12/16/19  2:45 PM  Result Value Ref Range   Neutrophils Relative % 69 %   Neutro Abs 8.1 (H) 1.7 - 7.7 K/uL   Lymphocytes Relative 18 %   Lymphs Abs 2.1 0.7 - 4.0 K/uL   Monocytes Relative 10 %   Monocytes Absolute 1.2 (H) 0 - 1 K/uL   Eosinophils Relative 2 %   Eosinophils Absolute 0.3 0 - 0 K/uL   Basophils Relative 1 %   Basophils Absolute 0.1 0 - 0 K/uL   Immature Granulocytes 0 %   Abs Immature Granulocytes 0.03 0.00 - 0.07 K/uL    Comment: Performed at [facility], [address] [place], [place] [address]  Comprehensive metabolic panel     Status: Abnormal   Collection Time: 12/16/19  2:45 PM  Result Value Ref Range   Sodium  132 (L) 135 - 145 mmol/L   Potassium 3.6 3.5 - 5.1 mmol/L   Chloride 98 98 - 111 mmol/L   CO2 25 22 - 32 mmol/L   Glucose, Bld 285 (H) 70 - 99 mg/dL    Comment: Glucose reference range applies only to samples taken after fasting for at least 8 hours.   BUN 24 (H) 8 - 23 mg/dL   Creatinine, Ser 1.19 0.61 - 1.24 mg/dL   Calcium 8.7 (L) 8.9 - 10.3 mg/dL   Total Protein 7.4 6.5 - 8.1 g/dL   Albumin 3.6 3.5 - 5.0 g/dL   AST 16 15 - 41 U/L   ALT 19 0 - 44 U/L   Alkaline Phosphatase 130 (H) 38 - 126 U/L   Total Bilirubin 0.6 0.3 - 1.2 mg/dL   GFR calc non Af Amer 54 (L) >60 mL/min   GFR calc Af Amer >60 >60 mL/min   Anion gap 9 5 - 15    Comment: Performed at [facility], [address], [place],  [address]  CBG monitoring, ED     Status: Abnormal   Collection Time: 12/16/19  4:01 PM  Result Value Ref Range   Glucose-Capillary 247 (H) 70 - 99 mg/dL    Comment: Glucose reference range applies only to samples taken after fasting for at least 8 hours.   Comment 1 Notify RN    Comment 2 Document in Chart   SARS Coronavirus 2 by RT PCR (hospital order, performed in [facility] hospital lab) Nasopharyngeal Nasopharyngeal Swab     Status: None   Collection Time: 12/16/19  5:50 PM   Specimen: Nasopharyngeal Swab  Result Value Ref Range   SARS Coronavirus 2 NEGATIVE NEGATIVE    Comment: (NOTE) SARS-CoV-2 target nucleic acids are NOT DETECTED.  The SARS-CoV-2 RNA is generally detectable in upper and lower respiratory specimens during the acute phase of infection. The lowest concentration of SARS-CoV-2 viral copies this assay can detect is 250 copies / mL. A negative result does not preclude SARS-CoV-2 infection and should not be used as the sole basis for treatment or other patient management decisions.  A negative result may occur with improper specimen collection / handling, submission of specimen other than nasopharyngeal swab, presence of viral mutation(s) within the areas  targeted by this assay, and inadequate number of viral copies (<250 copies /  mL). A negative result must be combined with clinical observations, patient history, and epidemiological information.  Fact Sheet for Patients:   StrictlyIdeas.no  Fact Sheet for Healthcare Providers: BankingDealers.co.za  This test is not yet approved or  cleared by the [place] FDA and has been authorized for detection and/or diagnosis of SARS-CoV-2 by FDA under an Emergency Use Authorization (EUA).  This EUA will remain in effect (meaning this test can be used) for the duration of the COVID-19 declaration under Section 564(b)(1) of the Act, 21 U.S.C. section 360bbb-3(b)(1), unless the authorization is terminated or revoked sooner.  Performed at [facility], [address], [place], [place] [address]   CBG monitoring, ED     Status: Abnormal   Collection Time: 12/17/19 12:55 AM  Result Value Ref Range   Glucose-Capillary 201 (H) 70 - 99 mg/dL    Comment: Glucose reference range applies only to samples taken after fasting for at least 8 hours.  Lipid panel     Status: Abnormal   Collection Time: 12/17/19  5:20 AM  Result Value Ref Range   Cholesterol 117 0 - 200 mg/dL   Triglycerides 150 (H) <150 mg/dL   HDL 31 (L) >40 mg/dL   Total CHOL/HDL Ratio 3.8 RATIO   VLDL 30 0 - 40 mg/dL   LDL Cholesterol 56 0 - 99 mg/dL    Comment:        Total Cholesterol/HDL:CHD Risk Coronary Heart Disease Risk Table                     Men   Women  1/2 Average Risk   3.4   3.3  Average Risk       5.0   4.4  2 X Average Risk   9.6   7.1  3 X Average Risk  23.4   11.0        Use the calculated Patient Ratio above and the CHD Risk Table to determine the patient's CHD Risk.        ATP III CLASSIFICATION (LDL):  <100     mg/dL   Optimal  100-129  mg/dL   Near or Above                    Optimal  130-159  mg/dL   Borderline  160-189  mg/dL   High  >190     mg/dL    Very High Performed at [facility], [address], [place], [place] [address]   CBG monitoring, ED     Status: Abnormal   Collection Time: 12/17/19  7:32 AM  Result Value Ref Range   Glucose-Capillary 119 (H) 70 - 99 mg/dL    Comment: Glucose reference range applies only to samples taken after fasting for at least 8 hours.   CT Angio Head W or Wo Contrast  Result Date: 12/16/2019 CLINICAL DATA:  85 year old male with 2 episodes of loss of vision in his right eye over the past 2-3 days. EXAM: CT ANGIOGRAPHY HEAD AND NECK TECHNIQUE: Multidetector CT imaging of the head and neck was performed using the standard protocol during bolus administration of intravenous contrast. Multiplanar CT image reconstructions and MIPs were obtained to evaluate the vascular anatomy. Carotid stenosis measurements (when applicable) are obtained utilizing NASCET criteria, using the distal internal carotid diameter as the denominator. CONTRAST:  55mL OMNIPAQUE IOHEXOL 350 MG/ML SOLN COMPARISON:  Chest CTA 09/04/2018. FINDINGS: CT HEAD Brain: Calcified atherosclerosis at the skull base. No midline  shift, mass effect, or evidence of intracranial mass lesion. No ventriculomegaly. No acute intracranial hemorrhage identified. No cortically based acute infarct identified. No cortical encephalomalacia identified. Mild for age scattered bilateral white matter hypodensity. Calvarium and skull base: No acute osseous abnormality identified. Paranasal sinuses: Visualized paranasal sinuses and mastoids are clear. Orbits: Postoperative changes to both globes, otherwise negative orbits soft tissues. Visualized scalp soft tissues are within normal limits. CTA NECK Skeleton: No acute osseous abnormality identified. Chronic appearing T3 compression fracture, as well as mild T1 and T2 superior endplate compression. Chronic left side cervical facet arthropathy. Upper chest: Partially calcified right apical lung scarring. Suggestion of a small  layering right pleural effusion. Otherwise negative. Other neck: Atrophied right submandibular gland.  No acute findings. Aortic arch: Calcified aortic atherosclerosis. 3 vessel arch configuration. Right carotid system: Mildly tortuous brachiocephalic artery without stenosis despite plaque. Negative right CCA origin. Minimal carotid plaque proximal to the bifurcation. Soft and calcified plaque at the right ICA origin, mostly calcified plaque at the distal bulb. No stenosis to the skull base. Left carotid system: No left CCA origin stenosis despite some plaque. Tortuous proximal left CCA. Minimal additional plaque before the bifurcation. Only mild calcified proximal left ICA plaque without stenosis. Vertebral arteries: Tortuous proximal right subclavian artery with a kinked appearance at the thoracic inlet (series 9, image 314), but minimal atherosclerosis. However, there is bulky calcified plaque at the right vertebral artery origin resulting in mild to moderate stenosis (series 10, image 181). The right vertebral then is patent and normal to the skull base. Soft and calcified plaque of the proximal left subclavian artery without stenosis. Bulky calcified plaque at the left ICA origin but only mild stenosis (series 9, image 283 and series 10, image 180). Tortuous left V1 segment. Codominant vertebral arteries, the left is patent to the skull base without additional plaque or stenosis. CTA HEAD Posterior circulation: The distal left vertebral artery is mildly dominant with left V4 calcified plaque resulting in mild stenosis upstream of the normal left PICA origin. Non dominant right V4 segment is normal to the vertebrobasilar junction with normal PICA. Patent basilar artery without stenosis. SCA origins are normal. There are fetal type bilateral PCA origins with tortuous posterior communicating arteries. There is a tortuous right P1. Bilateral P1 and P2 segments are within normal limits. No definite PCA branch  occlusion identified. Anterior circulation: Both ICA siphons are patent. On the left there is mild calcified plaque without significant stenosis. Left ophthalmic and posterior communicating artery origins are normal. Normal right carotid terminus. On the right there is mild to moderate calcified plaque with only mild stenosis at the anterior genu. Both the right ophthalmic and posterior communicating artery origins appear normal (with fairly symmetric appearing ophthalmic artery enhancement on series 9, image 122). Normal right ICA terminus. Normal MCA and ACA origins. Normal anterior communicating artery. Bilateral ACA branches are within normal limits. Left MCA M1 segment and bifurcation are patent without stenosis. Right MCA M1 segment and bifurcation are patent without stenosis. Bilateral MCA branches are within normal limits. Venous sinuses: Patent. Anatomic variants: Fetal type PCA origins more so the left. Mildly dominant distal left vertebral artery. Review of the MIP images confirms the above findings IMPRESSION: 1. Negative for large vessel occlusion. Right greater than left carotid atherosclerosis but no hemodynamically significant carotid stenosis. The Right Ophthalmic Artery appears within normal limits. 2. Up to moderate stenosis of the Right Vertebral Artery origin due to calcified plaque. Other calcified plaque without significant  stenosis. Aortic Atherosclerosis (ICD10-I70.0). 3. No acute intracranial abnormality by CT. Mild for age nonspecific cerebral white matter changes. 4. Small layering right pleural effusion suspected. 5. Chronic appearing upper thoracic compression fractures. Electronically Signed   By: Genevie Ann M.D.   On: 12/16/2019 16:55   CT Angio Neck W and/or Wo Contrast  Result Date: 12/16/2019 CLINICAL DATA:  86 year old male with 2 episodes of loss of vision in his right eye over the past 2-3 days. EXAM: CT ANGIOGRAPHY HEAD AND NECK TECHNIQUE: Multidetector CT imaging of the head  and neck was performed using the standard protocol during bolus administration of intravenous contrast. Multiplanar CT image reconstructions and MIPs were obtained to evaluate the vascular anatomy. Carotid stenosis measurements (when applicable) are obtained utilizing NASCET criteria, using the distal internal carotid diameter as the denominator. CONTRAST:  3mL OMNIPAQUE IOHEXOL 350 MG/ML SOLN COMPARISON:  Chest CTA 09/04/2018. FINDINGS: CT HEAD Brain: Calcified atherosclerosis at the skull base. No midline shift, mass effect, or evidence of intracranial mass lesion. No ventriculomegaly. No acute intracranial hemorrhage identified. No cortically based acute infarct identified. No cortical encephalomalacia identified. Mild for age scattered bilateral white matter hypodensity. Calvarium and skull base: No acute osseous abnormality identified. Paranasal sinuses: Visualized paranasal sinuses and mastoids are clear. Orbits: Postoperative changes to both globes, otherwise negative orbits soft tissues. Visualized scalp soft tissues are within normal limits. CTA NECK Skeleton: No acute osseous abnormality identified. Chronic appearing T3 compression fracture, as well as mild T1 and T2 superior endplate compression. Chronic left side cervical facet arthropathy. Upper chest: Partially calcified right apical lung scarring. Suggestion of a small layering right pleural effusion. Otherwise negative. Other neck: Atrophied right submandibular gland.  No acute findings. Aortic arch: Calcified aortic atherosclerosis. 3 vessel arch configuration. Right carotid system: Mildly tortuous brachiocephalic artery without stenosis despite plaque. Negative right CCA origin. Minimal carotid plaque proximal to the bifurcation. Soft and calcified plaque at the right ICA origin, mostly calcified plaque at the distal bulb. No stenosis to the skull base. Left carotid system: No left CCA origin stenosis despite some plaque. Tortuous proximal left  CCA. Minimal additional plaque before the bifurcation. Only mild calcified proximal left ICA plaque without stenosis. Vertebral arteries: Tortuous proximal right subclavian artery with a kinked appearance at the thoracic inlet (series 9, image 314), but minimal atherosclerosis. However, there is bulky calcified plaque at the right vertebral artery origin resulting in mild to moderate stenosis (series 10, image 181). The right vertebral then is patent and normal to the skull base. Soft and calcified plaque of the proximal left subclavian artery without stenosis. Bulky calcified plaque at the left ICA origin but only mild stenosis (series 9, image 283 and series 10, image 180). Tortuous left V1 segment. Codominant vertebral arteries, the left is patent to the skull base without additional plaque or stenosis. CTA HEAD Posterior circulation: The distal left vertebral artery is mildly dominant with left V4 calcified plaque resulting in mild stenosis upstream of the normal left PICA origin. Non dominant right V4 segment is normal to the vertebrobasilar junction with normal PICA. Patent basilar artery without stenosis. SCA origins are normal. There are fetal type bilateral PCA origins with tortuous posterior communicating arteries. There is a tortuous right P1. Bilateral P1 and P2 segments are within normal limits. No definite PCA branch occlusion identified. Anterior circulation: Both ICA siphons are patent. On the left there is mild calcified plaque without significant stenosis. Left ophthalmic and posterior communicating artery origins are normal. Normal right  carotid terminus. On the right there is mild to moderate calcified plaque with only mild stenosis at the anterior genu. Both the right ophthalmic and posterior communicating artery origins appear normal (with fairly symmetric appearing ophthalmic artery enhancement on series 9, image 122). Normal right ICA terminus. Normal MCA and ACA origins. Normal anterior  communicating artery. Bilateral ACA branches are within normal limits. Left MCA M1 segment and bifurcation are patent without stenosis. Right MCA M1 segment and bifurcation are patent without stenosis. Bilateral MCA branches are within normal limits. Venous sinuses: Patent. Anatomic variants: Fetal type PCA origins more so the left. Mildly dominant distal left vertebral artery. Review of the MIP images confirms the above findings IMPRESSION: 1. Negative for large vessel occlusion. Right greater than left carotid atherosclerosis but no hemodynamically significant carotid stenosis. The Right Ophthalmic Artery appears within normal limits. 2. Up to moderate stenosis of the Right Vertebral Artery origin due to calcified plaque. Other calcified plaque without significant stenosis. Aortic Atherosclerosis (ICD10-I70.0). 3. No acute intracranial abnormality by CT. Mild for age nonspecific cerebral white matter changes. 4. Small layering right pleural effusion suspected. 5. Chronic appearing upper thoracic compression fractures. Electronically Signed   By: Genevie Ann M.D.   On: 12/16/2019 16:55   MR BRAIN WO CONTRAST  Result Date: 12/16/2019 CLINICAL DATA:  TIA.  Right eye blurred vision and blindness. EXAM: MRI HEAD WITHOUT CONTRAST TECHNIQUE: Multiplanar, multiecho pulse sequences of the brain and surrounding structures were obtained without intravenous contrast. COMPARISON:  CT head 12/16/2019 FINDINGS: Brain: Negative for acute infarct. Mild white matter changes consistent with chronic microvascular ischemia. Mild atrophy. Negative for hydrocephalus. Negative for hemorrhage or mass. Vascular: Normal arterial flow voids Skull and upper cervical spine: No focal skeletal lesion. Sinuses/Orbits: Mild mucosal edema paranasal sinuses. Bilateral cataract extraction Other: None IMPRESSION: Negative for acute infarct. Mild atrophy and mild chronic microvascular ischemic change in the white matter. Electronically Signed   By:  Franchot Gallo M.D.   On: 12/16/2019 18:28    Pending Labs Unresulted Labs (From admission, onward)          Start     Ordered   12/17/19 0006  Hemoglobin A1c  Add-on,   AD       Comments: To assess prior glycemic control    12/17/19 0006          Vitals/Pain Today's Vitals   12/17/19 0400 12/17/19 0500 12/17/19 0553 12/17/19 0736  BP: 140/62 (!) 127/58  122/82  Pulse: (!) 59 (!) 55  (!) 54  Resp: 18 17  18   Temp:    98.1 F (36.7 C)  TempSrc:    Oral  SpO2: 95% 93%  94%  Weight:      Height:      PainSc:   0-No pain     Isolation Precautions No active isolations  Medications Medications  acetaminophen (TYLENOL) tablet 1,000 mg (has no administration in time range)  amLODipine (NORVASC) tablet 10 mg (has no administration in time range)  aspirin EC tablet 81 mg (has no administration in time range)  glipiZIDE (GLUCOTROL) tablet 5 mg (has no administration in time range)  indapamide (LOZOL) tablet 2.5 mg (has no administration in time range)  multivitamin with minerals tablet 1 tablet (1 tablet Oral Given 12/17/19 0100)  levothyroxine (SYNTHROID) tablet 75 mcg (75 mcg Oral Given 12/17/19 0613)  atorvastatin (LIPITOR) tablet 20 mg (20 mg Oral Given 12/17/19 0100)  losartan (COZAAR) tablet 100 mg (has no administration in time range)  gabapentin (NEURONTIN) capsule 300 mg (300 mg Oral Given 12/17/19 0059)  potassium chloride (KLOR-CON) packet 20 mEq (has no administration in time range)  insulin aspart (novoLOG) injection 0-9 Units (has no administration in time range)  insulin aspart (novoLOG) injection 0-5 Units (2 Units Subcutaneous Given 12/17/19 0100)   stroke: mapping our early stages of recovery book (has no administration in time range)  acetaminophen (TYLENOL) tablet 650 mg (has no administration in time range)    Or  acetaminophen (TYLENOL) 160 MG/5ML solution 650 mg (has no administration in time range)    Or  acetaminophen (TYLENOL) suppository 650 mg (has  no administration in time range)  senna-docusate (Senokot-S) tablet 1 tablet (has no administration in time range)  heparin injection 5,000 Units (5,000 Units Subcutaneous Given 12/17/19 0614)  insulin glargine (LANTUS) injection 35 Units (has no administration in time range)  iohexol (OMNIPAQUE) 350 MG/ML injection 75 mL (75 mLs Intravenous Contrast Given 12/16/19 1615)  aspirin chewable tablet 324 mg (324 mg Oral Given 12/16/19 1747)    Mobility walks Low fall risk   Focused Assessments    R Recommendations: See Admitting Provider Note  Report given to:   Additional Notes:

## 2019-12-17 NOTE — Plan of Care (Signed)
  Problem: Education: Goal: Knowledge of General Education information will improve Description: Including pain rating scale, medication(s)/side effects and non-pharmacologic comfort measures 12/17/2019 1336 by Melony Overly, RN Outcome: Adequate for Discharge 12/17/2019 1134 by Melony Overly, RN Outcome: Progressing   Problem: Health Behavior/Discharge Planning: Goal: Ability to manage health-related needs will improve 12/17/2019 1336 by Melony Overly, RN Outcome: Adequate for Discharge 12/17/2019 1134 by Melony Overly, RN Outcome: Progressing   Problem: Clinical Measurements: Goal: Ability to maintain clinical measurements within normal limits will improve 12/17/2019 1336 by Melony Overly, RN Outcome: Adequate for Discharge 12/17/2019 1134 by Melony Overly, RN Outcome: Progressing Goal: Will remain free from infection 12/17/2019 1336 by Melony Overly, RN Outcome: Adequate for Discharge 12/17/2019 1134 by Melony Overly, RN Outcome: Progressing Goal: Diagnostic test results will improve 12/17/2019 1336 by Melony Overly, RN Outcome: Adequate for Discharge 12/17/2019 1134 by Melony Overly, RN Outcome: Progressing Goal: Respiratory complications will improve 12/17/2019 1336 by Melony Overly, RN Outcome: Adequate for Discharge 12/17/2019 1134 by Melony Overly, RN Outcome: Progressing Goal: Cardiovascular complication will be avoided 12/17/2019 1336 by Melony Overly, RN Outcome: Adequate for Discharge 12/17/2019 1134 by Melony Overly, RN Outcome: Progressing   Problem: Activity: Goal: Risk for activity intolerance will decrease 12/17/2019 1336 by Melony Overly, RN Outcome: Adequate for Discharge 12/17/2019 1134 by Melony Overly, RN Outcome: Progressing   Problem: Nutrition: Goal: Adequate nutrition will be maintained 12/17/2019 1336 by Melony Overly, RN Outcome: Adequate for Discharge 12/17/2019 1134 by Melony Overly, RN Outcome: Progressing    Problem: Coping: Goal: Level of anxiety will decrease 12/17/2019 1336 by Melony Overly, RN Outcome: Adequate for Discharge 12/17/2019 1134 by Melony Overly, RN Outcome: Progressing   Problem: Elimination: Goal: Will not experience complications related to bowel motility 12/17/2019 1336 by Melony Overly, RN Outcome: Adequate for Discharge 12/17/2019 1134 by Melony Overly, RN Outcome: Progressing Goal: Will not experience complications related to urinary retention 12/17/2019 1336 by Melony Overly, RN Outcome: Adequate for Discharge 12/17/2019 1134 by Melony Overly, RN Outcome: Progressing   Problem: Pain Managment: Goal: General experience of comfort will improve 12/17/2019 1336 by Melony Overly, RN Outcome: Adequate for Discharge 12/17/2019 1134 by Melony Overly, RN Outcome: Progressing   Problem: Safety: Goal: Ability to remain free from injury will improve 12/17/2019 1336 by Melony Overly, RN Outcome: Adequate for Discharge 12/17/2019 1134 by Melony Overly, RN Outcome: Progressing   Problem: Skin Integrity: Goal: Risk for impaired skin integrity will decrease 12/17/2019 1336 by Melony Overly, RN Outcome: Adequate for Discharge 12/17/2019 1134 by Melony Overly, RN Outcome: Progressing

## 2019-12-17 NOTE — ED Notes (Signed)
Patient transported to hallway 6, hearing aid placed in ear by patient other belongins with pt

## 2019-12-17 NOTE — Plan of Care (Signed)
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
## 2019-12-17 NOTE — TOC Transition Note (Signed)
Transition of Care [facility]) - CM/SW Discharge Note  Patient Details  Name: Alexander Clayton MRN: [id number] Date of Birth: 08/20/1927  [facility]) CM/SW Contact:  Sherie Don, LCSW Phone Number: 12/17/2019, 1:10 PM  Clinical Narrative: Patient is an 86 year old male who is under observation for chronic kidney disease. Per PT evaluation, OP PT is recommended. CSW spoke with patient regarding OP PT and patient agreeable to referral to [facility] OP. OP PT order completed. TOC signing off.  Final next level of care: OP Rehab Barriers to Discharge: Barriers Resolved  Patient Goals and CMS Choice Patient states their goals for this hospitalization and ongoing recovery are:: Discharge home with OP PT CMS Medicare.gov Compare Post Acute Care list provided to:: Patient Choice offered to / list presented to : Patient  Discharge Plan and Services       DME Arranged: N/A DME Agency: NA HH Arranged: NA [place] Agency: NA  Readmission Risk Interventions No flowsheet data found.

## 2019-12-17 NOTE — Discharge Summary (Signed)
Physician Discharge Summary  Alexander Clayton QAS:[id number] DOB: 10/30/1927 DOA: 12/16/2019  PCP: Asencion Noble, MD  Admit date: 12/16/2019 Discharge date: 12/17/2019  Admitted From: Home Disposition: Home  Recommendations for Outpatient Follow-up:  1. Follow up with PCP in 1-2 weeks 2. Please obtain BMP/CBC in one week your next doctors visit.  3. Continue home aspirin and statin 4. Outpatient referral to neurology given, follow-up in next 3-4 weeks   Discharge Condition: Stable CODE STATUS: Full code Diet recommendation: Heart healthy  Brief/Interim Summary: 86 year old with history of HTN, HLD, DM 2 reported of transient loss of vision on the right side last Thursday, went to go see his ophthalmology but they were closed.  His symptoms resolved after 30 minutes.  The symptoms occurred again on Sunday for 30 minutes therefore went to go see his ophthalmology on Monday.  Full eye exam was overall unremarkable but was sent to the hospital for evaluation of CVA.  In the hospital routine work-up including MRI brain was unremarkable.  LDL was 56.  Echocardiogram was also performed- normal.  He was advised to follow-up outpatient with neurology.  At this time patient is asymptomatic and feels back to his baseline no complaints.  Wishes to go home today.  Body mass index is 30.79 kg/m.    Discharge Diagnoses:  Active Problems:   CKD (chronic kidney disease) stage 3, GFR 30-59 ml/min   Essential hypertension   Mixed hyperlipidemia   Focal neurological deficit    Subjective: Patient is asymptomatic, he feels great and no complaints.  Tells me he takes aspirin and statin at home daily.  Discharge Exam: Vitals:   12/17/19 0736 12/17/19 0937  BP: 122/82 (!) 147/79  Pulse: (!) 54 72  Resp: 18 18  Temp: 98.1 F (36.7 C) 98.1 F (36.7 C)  SpO2: 94% 97%   Vitals:   12/17/19 0400 12/17/19 0500 12/17/19 0736 12/17/19 0937  BP: 140/62 (!) 127/58 122/82 (!) 147/79  Pulse: (!) 59  (!) 55 (!) 54 72  Resp: 18 17 18 18   Temp:   98.1 F (36.7 C) 98.1 F (36.7 C)  TempSrc:   Oral Oral  SpO2: 95% 93% 94% 97%  Weight:    90.5 kg  Height:    5' 7.5" (1.715 m)    General: Pt is alert, awake, not in acute distress Cardiovascular: RRR, S1/S2 +, no rubs, no gallops Respiratory: CTA bilaterally, no wheezing, no rhonchi Abdominal: Soft, NT, ND, bowel sounds + Extremities: no edema, no cyanosis  Discharge Instructions  Discharge Instructions    Ambulatory referral to Neurology   Complete by: As directed    Post TIA follow up   Discharge patient   Complete by: As directed    After echo is resulted   Discharge disposition: 01-Home or Self Care   Discharge patient date: 12/17/2019     Allergies as of 12/17/2019      Reactions   Ciprofloxacin Hives, Rash   Vioxx [rofecoxib] Hives, Rash   sweating      Medication List    TAKE these medications   acetaminophen 500 MG tablet Commonly known as: TYLENOL Take 1,000 mg by mouth 3 (three) times daily.   amLODipine 10 MG tablet Commonly known as: NORVASC Take 10 mg by mouth every morning.   aspirin EC 81 MG tablet Take 81 mg by mouth at bedtime.   atorvastatin 20 MG tablet Commonly known as: LIPITOR Take 20 mg by mouth at bedtime.   cetirizine 10 MG  tablet Commonly known as: ZYRTEC Take 10 mg by mouth at bedtime.   gabapentin 300 MG capsule Commonly known as: NEURONTIN Take 300 mg by mouth 2 (two) times a day.   glipiZIDE 5 MG tablet Commonly known as: GLUCOTROL Take 5 mg by mouth every morning.   ICAPS AREDS 2 PO Take 1 tablet by mouth 2 (two) times a day.   indapamide 2.5 MG tablet Commonly known as: LOZOL Take 2.5 mg by mouth every morning.   Lantus 100 UNIT/ML injection Generic drug: insulin glargine Inject 35 Units into the skin daily.   levothyroxine 75 MCG tablet Commonly known as: SYNTHROID Take 75 mcg by mouth daily before breakfast.   losartan 100 MG tablet Commonly known as:  COZAAR Take 100 mg by mouth every morning.   metFORMIN 500 MG tablet Commonly known as: GLUCOPHAGE Take 500 mg by mouth 2 (two) times daily.   multivitamin capsule Take 1 capsule by mouth at bedtime.   potassium chloride SA 20 MEQ tablet Commonly known as: KLOR-CON potassium chloride ER 20 mEq tablet,extended release  TK 1 T PO QD       Follow-up Information    Asencion Noble, MD. Schedule an appointment as soon as possible for a visit in 1 week(s).   Specialty: Internal Medicine Contact information: [address] [place] [address] [phone]        Fay Records, MD .   Specialty: Cardiology Contact information: [address] [place] [address] [phone]        Phillips Odor, MD. Schedule an appointment as soon as possible for a visit in 1 week(s).   Specialty: Neurology Contact information: [address] [place] [address] [phone]              Allergies  Allergen Reactions  . Ciprofloxacin Hives and Rash  . Vioxx [Rofecoxib] Hives and Rash    sweating    You were cared for by a hospitalist during your hospital stay. If you have any questions about your discharge medications or the care you received while you were in the hospital after you are discharged, you can call the unit and asked to speak with the hospitalist on call if the hospitalist that took care of you is not available. Once you are discharged, your primary care physician will handle any further medical issues. Please note that no refills for any discharge medications will be authorized once you are discharged, as it is imperative that you return to your primary care physician (or establish a relationship with a primary care physician if you do not have one) for your aftercare needs so that they can reassess your need for medications and monitor your lab values.   Procedures/Studies: CT Angio Head W or Wo Contrast  Result Date: 12/16/2019 CLINICAL DATA:   86 year old male with 2 episodes of loss of vision in his right eye over the past 2-3 days. EXAM: CT ANGIOGRAPHY HEAD AND NECK TECHNIQUE: Multidetector CT imaging of the head and neck was performed using the standard protocol during bolus administration of intravenous contrast. Multiplanar CT image reconstructions and MIPs were obtained to evaluate the vascular anatomy. Carotid stenosis measurements (when applicable) are obtained utilizing NASCET criteria, using the distal internal carotid diameter as the denominator. CONTRAST:  58mL OMNIPAQUE IOHEXOL 350 MG/ML SOLN COMPARISON:  Chest CTA 09/04/2018. FINDINGS: CT HEAD Brain: Calcified atherosclerosis at the skull base. No midline shift, mass effect, or evidence of intracranial mass lesion. No ventriculomegaly. No acute  intracranial hemorrhage identified. No cortically based acute infarct identified. No cortical encephalomalacia identified. Mild for age scattered bilateral white matter hypodensity. Calvarium and skull base: No acute osseous abnormality identified. Paranasal sinuses: Visualized paranasal sinuses and mastoids are clear. Orbits: Postoperative changes to both globes, otherwise negative orbits soft tissues. Visualized scalp soft tissues are within normal limits. CTA NECK Skeleton: No acute osseous abnormality identified. Chronic appearing T3 compression fracture, as well as mild T1 and T2 superior endplate compression. Chronic left side cervical facet arthropathy. Upper chest: Partially calcified right apical lung scarring. Suggestion of a small layering right pleural effusion. Otherwise negative. Other neck: Atrophied right submandibular gland.  No acute findings. Aortic arch: Calcified aortic atherosclerosis. 3 vessel arch configuration. Right carotid system: Mildly tortuous brachiocephalic artery without stenosis despite plaque. Negative right CCA origin. Minimal carotid plaque proximal to the bifurcation. Soft and calcified plaque at the right ICA  origin, mostly calcified plaque at the distal bulb. No stenosis to the skull base. Left carotid system: No left CCA origin stenosis despite some plaque. Tortuous proximal left CCA. Minimal additional plaque before the bifurcation. Only mild calcified proximal left ICA plaque without stenosis. Vertebral arteries: Tortuous proximal right subclavian artery with a kinked appearance at the thoracic inlet (series 9, image 314), but minimal atherosclerosis. However, there is bulky calcified plaque at the right vertebral artery origin resulting in mild to moderate stenosis (series 10, image 181). The right vertebral then is patent and normal to the skull base. Soft and calcified plaque of the proximal left subclavian artery without stenosis. Bulky calcified plaque at the left ICA origin but only mild stenosis (series 9, image 283 and series 10, image 180). Tortuous left V1 segment. Codominant vertebral arteries, the left is patent to the skull base without additional plaque or stenosis. CTA HEAD Posterior circulation: The distal left vertebral artery is mildly dominant with left V4 calcified plaque resulting in mild stenosis upstream of the normal left PICA origin. Non dominant right V4 segment is normal to the vertebrobasilar junction with normal PICA. Patent basilar artery without stenosis. SCA origins are normal. There are fetal type bilateral PCA origins with tortuous posterior communicating arteries. There is a tortuous right P1. Bilateral P1 and P2 segments are within normal limits. No definite PCA branch occlusion identified. Anterior circulation: Both ICA siphons are patent. On the left there is mild calcified plaque without significant stenosis. Left ophthalmic and posterior communicating artery origins are normal. Normal right carotid terminus. On the right there is mild to moderate calcified plaque with only mild stenosis at the anterior genu. Both the right ophthalmic and posterior communicating artery origins  appear normal (with fairly symmetric appearing ophthalmic artery enhancement on series 9, image 122). Normal right ICA terminus. Normal MCA and ACA origins. Normal anterior communicating artery. Bilateral ACA branches are within normal limits. Left MCA M1 segment and bifurcation are patent without stenosis. Right MCA M1 segment and bifurcation are patent without stenosis. Bilateral MCA branches are within normal limits. Venous sinuses: Patent. Anatomic variants: Fetal type PCA origins more so the left. Mildly dominant distal left vertebral artery. Review of the MIP images confirms the above findings IMPRESSION: 1. Negative for large vessel occlusion. Right greater than left carotid atherosclerosis but no hemodynamically significant carotid stenosis. The Right Ophthalmic Artery appears within normal limits. 2. Up to moderate stenosis of the Right Vertebral Artery origin due to calcified plaque. Other calcified plaque without significant stenosis. Aortic Atherosclerosis (ICD10-I70.0). 3. No acute intracranial abnormality by CT. Mild for  age nonspecific cerebral white matter changes. 4. Small layering right pleural effusion suspected. 5. Chronic appearing upper thoracic compression fractures. Electronically Signed   By: Genevie Ann M.D.   On: 12/16/2019 16:55   CT Angio Neck W and/or Wo Contrast  Result Date: 12/16/2019 CLINICAL DATA:  86 year old male with 2 episodes of loss of vision in his right eye over the past 2-3 days. EXAM: CT ANGIOGRAPHY HEAD AND NECK TECHNIQUE: Multidetector CT imaging of the head and neck was performed using the standard protocol during bolus administration of intravenous contrast. Multiplanar CT image reconstructions and MIPs were obtained to evaluate the vascular anatomy. Carotid stenosis measurements (when applicable) are obtained utilizing NASCET criteria, using the distal internal carotid diameter as the denominator. CONTRAST:  20mL OMNIPAQUE IOHEXOL 350 MG/ML SOLN COMPARISON:  Chest  CTA 09/04/2018. FINDINGS: CT HEAD Brain: Calcified atherosclerosis at the skull base. No midline shift, mass effect, or evidence of intracranial mass lesion. No ventriculomegaly. No acute intracranial hemorrhage identified. No cortically based acute infarct identified. No cortical encephalomalacia identified. Mild for age scattered bilateral white matter hypodensity. Calvarium and skull base: No acute osseous abnormality identified. Paranasal sinuses: Visualized paranasal sinuses and mastoids are clear. Orbits: Postoperative changes to both globes, otherwise negative orbits soft tissues. Visualized scalp soft tissues are within normal limits. CTA NECK Skeleton: No acute osseous abnormality identified. Chronic appearing T3 compression fracture, as well as mild T1 and T2 superior endplate compression. Chronic left side cervical facet arthropathy. Upper chest: Partially calcified right apical lung scarring. Suggestion of a small layering right pleural effusion. Otherwise negative. Other neck: Atrophied right submandibular gland.  No acute findings. Aortic arch: Calcified aortic atherosclerosis. 3 vessel arch configuration. Right carotid system: Mildly tortuous brachiocephalic artery without stenosis despite plaque. Negative right CCA origin. Minimal carotid plaque proximal to the bifurcation. Soft and calcified plaque at the right ICA origin, mostly calcified plaque at the distal bulb. No stenosis to the skull base. Left carotid system: No left CCA origin stenosis despite some plaque. Tortuous proximal left CCA. Minimal additional plaque before the bifurcation. Only mild calcified proximal left ICA plaque without stenosis. Vertebral arteries: Tortuous proximal right subclavian artery with a kinked appearance at the thoracic inlet (series 9, image 314), but minimal atherosclerosis. However, there is bulky calcified plaque at the right vertebral artery origin resulting in mild to moderate stenosis (series 10, image 181).  The right vertebral then is patent and normal to the skull base. Soft and calcified plaque of the proximal left subclavian artery without stenosis. Bulky calcified plaque at the left ICA origin but only mild stenosis (series 9, image 283 and series 10, image 180). Tortuous left V1 segment. Codominant vertebral arteries, the left is patent to the skull base without additional plaque or stenosis. CTA HEAD Posterior circulation: The distal left vertebral artery is mildly dominant with left V4 calcified plaque resulting in mild stenosis upstream of the normal left PICA origin. Non dominant right V4 segment is normal to the vertebrobasilar junction with normal PICA. Patent basilar artery without stenosis. SCA origins are normal. There are fetal type bilateral PCA origins with tortuous posterior communicating arteries. There is a tortuous right P1. Bilateral P1 and P2 segments are within normal limits. No definite PCA branch occlusion identified. Anterior circulation: Both ICA siphons are patent. On the left there is mild calcified plaque without significant stenosis. Left ophthalmic and posterior communicating artery origins are normal. Normal right carotid terminus. On the right there is mild to moderate calcified plaque with  only mild stenosis at the anterior genu. Both the right ophthalmic and posterior communicating artery origins appear normal (with fairly symmetric appearing ophthalmic artery enhancement on series 9, image 122). Normal right ICA terminus. Normal MCA and ACA origins. Normal anterior communicating artery. Bilateral ACA branches are within normal limits. Left MCA M1 segment and bifurcation are patent without stenosis. Right MCA M1 segment and bifurcation are patent without stenosis. Bilateral MCA branches are within normal limits. Venous sinuses: Patent. Anatomic variants: Fetal type PCA origins more so the left. Mildly dominant distal left vertebral artery. Review of the MIP images confirms the above  findings IMPRESSION: 1. Negative for large vessel occlusion. Right greater than left carotid atherosclerosis but no hemodynamically significant carotid stenosis. The Right Ophthalmic Artery appears within normal limits. 2. Up to moderate stenosis of the Right Vertebral Artery origin due to calcified plaque. Other calcified plaque without significant stenosis. Aortic Atherosclerosis (ICD10-I70.0). 3. No acute intracranial abnormality by CT. Mild for age nonspecific cerebral white matter changes. 4. Small layering right pleural effusion suspected. 5. Chronic appearing upper thoracic compression fractures. Electronically Signed   By: Genevie Ann M.D.   On: 12/16/2019 16:55   MR BRAIN WO CONTRAST  Result Date: 12/16/2019 CLINICAL DATA:  TIA.  Right eye blurred vision and blindness. EXAM: MRI HEAD WITHOUT CONTRAST TECHNIQUE: Multiplanar, multiecho pulse sequences of the brain and surrounding structures were obtained without intravenous contrast. COMPARISON:  CT head 12/16/2019 FINDINGS: Brain: Negative for acute infarct. Mild white matter changes consistent with chronic microvascular ischemia. Mild atrophy. Negative for hydrocephalus. Negative for hemorrhage or mass. Vascular: Normal arterial flow voids Skull and upper cervical spine: No focal skeletal lesion. Sinuses/Orbits: Mild mucosal edema paranasal sinuses. Bilateral cataract extraction Other: None IMPRESSION: Negative for acute infarct. Mild atrophy and mild chronic microvascular ischemic change in the white matter. Electronically Signed   By: Franchot Gallo M.D.   On: 12/16/2019 18:28      The results of significant diagnostics from this hospitalization (including imaging, microbiology, ancillary and laboratory) are listed below for reference.     Microbiology: Recent Results (from the past 240 hour(s))  SARS Coronavirus 2 by RT PCR (hospital order, performed in [facility] hospital lab) Nasopharyngeal Nasopharyngeal Swab     Status: None   Collection  Time: 12/16/19  5:50 PM   Specimen: Nasopharyngeal Swab  Result Value Ref Range Status   SARS Coronavirus 2 NEGATIVE NEGATIVE Final    Comment: (NOTE) SARS-CoV-2 target nucleic acids are NOT DETECTED.  The SARS-CoV-2 RNA is generally detectable in upper and lower respiratory specimens during the acute phase of infection. The lowest concentration of SARS-CoV-2 viral copies this assay can detect is 250 copies / mL. A negative result does not preclude SARS-CoV-2 infection and should not be used as the sole basis for treatment or other patient management decisions.  A negative result may occur with improper specimen collection / handling, submission of specimen other than nasopharyngeal swab, presence of viral mutation(s) within the areas targeted by this assay, and inadequate number of viral copies (<250 copies / mL). A negative result must be combined with clinical observations, patient history, and epidemiological information.  Fact Sheet for Patients:   StrictlyIdeas.no  Fact Sheet for Healthcare Providers: BankingDealers.co.za  This test is not yet approved or  cleared by the [place] FDA and has been authorized for detection and/or diagnosis of SARS-CoV-2 by FDA under an Emergency Use Authorization (EUA).  This EUA will remain in effect (meaning this test can  be used) for the duration of the COVID-19 declaration under Section 564(b)(1) of the Act, 21 U.S.C. section 360bbb-3(b)(1), unless the authorization is terminated or revoked sooner.  Performed at [facility], [address] [place], [place] [address]      Labs: BNP (last 3 results) No results for input(s): BNP in the last 8760 hours. Basic Metabolic Panel: Recent Labs  Lab 12/16/19 1445  NA 132*  K 3.6  CL 98  CO2 25  GLUCOSE 285*  BUN 24*  CREATININE 1.19  CALCIUM 8.7*   Liver Function Tests: Recent Labs  Lab 12/16/19 1445  AST 16  ALT 19  ALKPHOS  130*  BILITOT 0.6  PROT 7.4  ALBUMIN 3.6   No results for input(s): LIPASE, AMYLASE in the last 168 hours. No results for input(s): AMMONIA in the last 168 hours. CBC: Recent Labs  Lab 12/16/19 1445  WBC 11.8*  NEUTROABS 8.1*  HGB 12.6*  HCT 37.5*  MCV 91.0  PLT 378   Cardiac Enzymes: No results for input(s): CKTOTAL, CKMB, CKMBINDEX, TROPONINI in the last 168 hours. BNP: Invalid input(s): POCBNP CBG: Recent Labs  Lab 12/16/19 1601 12/17/19 0055 12/17/19 0732 12/17/19 1111  GLUCAP 247* 201* 119* 254*   D-Dimer No results for input(s): DDIMER in the last 72 hours. Hgb A1c No results for input(s): HGBA1C in the last 72 hours. Lipid Profile Recent Labs    12/17/19 0520  CHOL 117  HDL 31*  LDLCALC 56  TRIG 150*  CHOLHDL 3.8   Thyroid function studies No results for input(s): TSH, T4TOTAL, T3FREE, THYROIDAB in the last 72 hours.  Invalid input(s): FREET3 Anemia work up No results for input(s): VITAMINB12, FOLATE, FERRITIN, TIBC, IRON, RETICCTPCT in the last 72 hours. Urinalysis No results found for: COLORURINE, APPEARANCEUR, [place], [place], [place], [place], [place], [place], PROTEINUR, UROBILINOGEN, NITRITE, LEUKOCYTESUR Sepsis Labs Invalid input(s): PROCALCITONIN,  WBC,  LACTICIDVEN Microbiology Recent Results (from the past 240 hour(s))  SARS Coronavirus 2 by RT PCR (hospital order, performed in [facility] hospital lab) Nasopharyngeal Nasopharyngeal Swab     Status: None   Collection Time: 12/16/19  5:50 PM   Specimen: Nasopharyngeal Swab  Result Value Ref Range Status   SARS Coronavirus 2 NEGATIVE NEGATIVE Final    Comment: (NOTE) SARS-CoV-2 target nucleic acids are NOT DETECTED.  The SARS-CoV-2 RNA is generally detectable in upper and lower respiratory specimens during the acute phase of infection. The lowest concentration of SARS-CoV-2 viral copies this assay can detect is 250 copies / mL. A negative result does not preclude SARS-CoV-2  infection and should not be used as the sole basis for treatment or other patient management decisions.  A negative result may occur with improper specimen collection / handling, submission of specimen other than nasopharyngeal swab, presence of viral mutation(s) within the areas targeted by this assay, and inadequate number of viral copies (<250 copies / mL). A negative result must be combined with clinical observations, patient history, and epidemiological information.  Fact Sheet for Patients:   StrictlyIdeas.no  Fact Sheet for Healthcare Providers: BankingDealers.co.za  This test is not yet approved or  cleared by the [place] FDA and has been authorized for detection and/or diagnosis of SARS-CoV-2 by FDA under an Emergency Use Authorization (EUA).  This EUA will remain in effect (meaning this test can be used) for the duration of the COVID-19 declaration under Section 564(b)(1) of the Act, 21 U.S.C. section 360bbb-3(b)(1), unless the authorization is terminated or revoked sooner.  Performed at [facility]  [facility], [address] [place], [place] [address]      Time coordinating discharge:  I have spent 35 minutes face to face with the patient and on the ward discussing the patients care, assessment, plan and disposition with other care givers. >50% of the time was devoted counseling the patient about the risks and benefits of treatment/Discharge disposition and coordinating care.   SIGNED:   Damita Lack, MD  [facility] 12/17/2019, 11:30 AM   If 7PM-7AM, please contact night-coverage

## 2019-12-17 NOTE — Progress Notes (Signed)
*  PRELIMINARY RESULTS* Echocardiogram 2D Echocardiogram has been performed.  Alexander Clayton 12/17/2019, 10:20 AM

## 2019-12-17 NOTE — Progress Notes (Signed)
SLP Cancellation Note  Patient Details Name: Alexander Clayton MRN: [id number] DOB: 07/17/1927   Cancelled treatment:       Reason Eval/Treat Not Completed: SLP screened, no needs identified, will sign off; SLP screened Pt in room. Pt denies any changes in swallowing, speech, language, or cognition. MRI negative for acute changes. SLE will be deferred at this time. Reconsult if indicated. SLP will sign off.   Thank you,  Genene Churn, [place]  [place] 12/17/2019, 2:25 PM

## 2019-12-18 ENCOUNTER — Telehealth (HOSPITAL_COMMUNITY): Payer: Self-pay | Admitting: Physical Therapy

## 2019-12-18 ENCOUNTER — Ambulatory Visit (HOSPITAL_COMMUNITY): Payer: Medicare Other | Admitting: Physical Therapy

## 2019-12-18 NOTE — Telephone Encounter (Signed)
pt cancelled appt because he said that he is not ambulatory and does not want to come

## 2019-12-20 DIAGNOSIS — I10 Essential (primary) hypertension: Secondary | ICD-10-CM | POA: Diagnosis not present

## 2019-12-20 DIAGNOSIS — Z8673 Personal history of transient ischemic attack (TIA), and cerebral infarction without residual deficits: Secondary | ICD-10-CM | POA: Diagnosis not present

## 2019-12-30 NOTE — Progress Notes (Signed)
Cardiology Office Note  Date: 12/31/2019   ID: ANDONI BUSCH, DOB [birth year], MRN [id number]  PCP:  Asencion Noble, MD  Cardiologist:  Dorris Carnes, MD Electrophysiologist:  None   Chief Complaint: Hospital follow-up TIA  History of Present Illness: SHONDALE QUINLEY is a 86 y.o. male with a history of CAD, HTN, HLD, type II DM, bladder CVA, CAD by coronary CTA, CKD stage III, hepatic steatosis, hiatal hernia.  Last encounter was Sharrell Ku, [place] 05/14/2019 for follow-up of CAD.  He was doing well from a cardiac standpoint.  Activity was limited by knee arthritis.  No chest pain, dyspnea, orthopnea, palpitations, syncope.  Recent presentation for transient loss of vision on right side 12/16/2019.  His symptoms resolved after 30 minutes.  They recurred again on Sunday for 30 minutes.  He went to see ophthalmology on the following Monday.  Eye exam was overall unremarkable but was then sent to hospital for evaluation of CVA.  Work-up included CT of head and neck and MRI brain were unremarkable.  Echocardiogram was performed and was normal.  He was advised to follow-up outpatient with neurology.  Patient states she been doing well from a cardiac standpoint.  He continues to have what he describes as like a fog over his right.  His CT of head and neck and MRI showed no acute infarct.  He denies any other focal neurologic deficits.  Denies any recent acute illnesses prior to presenting to the hospital.  He recently had a negative work-up at his ophthalmologist.  MRI of the head was negative for acute infarct.  Mild atrophy and mild chronic microvascular ischemic changes in the white matter.  CT of the head and neck negative for large vessel occlusion.  Right greater than left carotid atherosclerosis with no hemodynamically significant artery stenosis.  Right ophthalmic artery.  Within normal limits.  Up to moderate stenosis of the right vertebral artery origin due to calcified plaque.  Other calcified  plaque without significant stenosis.  Aortic atherosclerosis.  No acute intracranial abnormality by CT.  No acute intracranial abnormality by CT.  Mild for age nonspecific cerebral white matter changes.  Patient has no cardiac complaints today he denies any anginal or exertional symptoms, palpitations or arrhythmias, orthostatic symptoms, CVA or TIA-like symptoms beyond vision changes in the right eye.  No PND/orthopnea.  Past Medical History:  Diagnosis Date  . Bladder cancer ([place])   . CAD (coronary artery disease)    a. per coronary CTA 09/2018 - managed medically.  . CKD (chronic kidney disease), stage III   . Diabetes mellitus without complication ([place])   . Hepatic steatosis    by CT 08/2018  . Hiatal hernia    small by CT 08/2018  . Hypercholesteremia   . Hypertension     Past Surgical History:  Procedure Laterality Date  . APPENDECTOMY    . HERNIA REPAIR      Current Outpatient Medications  Medication Sig Dispense Refill  . acetaminophen (TYLENOL) 500 MG tablet Take 1,000 mg by mouth 3 (three) times daily.     Marland Kitchen amLODipine (NORVASC) 10 MG tablet Take 10 mg by mouth every morning.     Marland Kitchen aspirin EC 81 MG tablet Take 81 mg by mouth at bedtime.     Marland Kitchen atorvastatin (LIPITOR) 20 MG tablet Take 20 mg by mouth at bedtime.     . cetirizine (ZYRTEC) 10 MG tablet Take 10 mg by mouth at bedtime.     . gabapentin (  NEURONTIN) 300 MG capsule Take 300 mg by mouth 2 (two) times a day.    Marland Kitchen glipiZIDE (GLUCOTROL) 5 MG tablet Take 5 mg by mouth every morning.     . indapamide (LOZOL) 2.5 MG tablet Take 2.5 mg by mouth every morning.     . insulin glargine (LANTUS) 100 UNIT/ML injection Inject 35 Units into the skin daily.     Marland Kitchen levothyroxine (SYNTHROID) 75 MCG tablet Take 75 mcg by mouth daily before breakfast.    . losartan (COZAAR) 100 MG tablet Take 100 mg by mouth every morning.    . metFORMIN (GLUCOPHAGE) 500 MG tablet Take 500 mg by mouth 2 (two) times daily.     . Multiple Vitamin  (MULTIVITAMIN) capsule Take 1 capsule by mouth at bedtime.     . Multiple Vitamins-Minerals (ICAPS AREDS 2 PO) Take 1 tablet by mouth 2 (two) times a day.     . Potassium Chloride ER 20 MEQ TBCR Take 1 tablet by mouth daily.     No current facility-administered medications for this visit.   Allergies:  Ciprofloxacin and Vioxx [rofecoxib]   Social History: The patient  reports that he has never smoked. He has never used smokeless tobacco. He reports that he does not drink alcohol and does not use drugs.   Family History: The patient's family history includes Hypertension in his father.   ROS:  Please see the history of present illness. Otherwise, complete review of systems is positive for none.  All other systems are reviewed and negative.   Physical Exam: VS:  BP 130/60   Pulse 70   Ht 5' 7.5" (1.715 m)   Wt 201 lb 9.6 oz (91.4 kg)   SpO2 98%   BMI 31.11 kg/m , BMI Body mass index is 31.11 kg/m.  Wt Readings from Last 3 Encounters:  12/31/19 201 lb 9.6 oz (91.4 kg)  12/17/19 199 lb 8.3 oz (90.5 kg)  05/14/19 209 lb (94.8 kg)    General: Patient appears comfortable at rest. Neck: Supple, no elevated JVP or carotid bruits, no thyromegaly. Lungs: Clear to auscultation, nonlabored breathing at rest. Cardiac: Regular rate and rhythm, no S3 or significant systolic murmur, no pericardial rub. Extremities: No pitting edema, distal pulses 2+. Skin: Warm and dry. Musculoskeletal: No kyphosis. Neuropsychiatric: Alert and oriented x3, affect grossly appropriate.  ECG:  EKG 12/16/2019 at [facility], ED.  Sinus rhythm/sinus bradycardia rate of 59, borderline right axis deviation, nonspecific ST abnormalities, anterolateral leads.  Recent Labwork: 12/16/2019: ALT 19; AST 16; BUN 24; Creatinine, Ser 1.19; Hemoglobin 12.6; Platelets 378; Potassium 3.6; Sodium 132     Component Value Date/Time   CHOL 117 12/17/2019 0520   TRIG 150 (H) 12/17/2019 0520   HDL 31 (L) 12/17/2019 0520   CHOLHDL  3.8 12/17/2019 0520   VLDL 30 12/17/2019 0520   LDLCALC 56 12/17/2019 0520    Other Studies Reviewed Today:  Echocardiogram 12/17/2019  1. Left ventricular ejection fraction, by estimation, is 55 to 60%. The left ventricle has normal function. The left ventricle has no regional wall motion abnormalities. There is mild left ventricular hypertrophy. Left ventricular diastolic parameters are consistent with Grade I diastolic dysfunction (impaired relaxation). 2. Right ventricular systolic function is normal. The right ventricular size is normal. 3. The mitral valve is normal in structure. No evidence of mitral valve regurgitation. No evidence of mitral stenosis. 4. The aortic valve was not well visualized. Aortic valve regurgitation is not visualized. No aortic stenosis is present.  5. The inferior vena cava is normal in size with greater than 50% respiratory variability, suggesting right atrial pressure of 3 mmHg.  Coronary CTA 10/31/2018 Narrative & Impression  EXAM: CT FFR ANALYSIS  CLINICAL DATA:  86 year old male with chest pain.  FINDINGS: FFRct analysis was performed on the original cardiac CT angiogram dataset. Diagrammatic representation of the FFRct analysis is provided in a separate PDF document in PACS. This dictation was created using the PDF document and an interactive 3D model of the results. 3D model is not available in the EMR/PACS. Normal FFR range is >0.80.  1. Left Main: 0.98. 2. LAD: Proximal: 0.96.  Mid: 0.85.  Distal 0.77. 3. Ramus intermedius: Proximal: 0.93.  Mid: 0.80.  Distal: 0.76. 4. LCX: Proximal: 0.97.  Mid: 0.84.  Distal: 0.68. 5. RCA: Proximal: 0.99.  Mid: 0.90.  Distal: 0.80.  IMPRESSION: 1. CT FFR analysis showed stenoses in very portions of LAD, ramus intermedius and in the mid portion of a non-dominant LCX artery. Aggressive medical management is recommended    Assessment and Plan:  1. CAD in native artery   2. Essential  hypertension   3. Mixed hyperlipidemia   4. Stage 3 chronic kidney disease, unspecified whether stage 3a or 3b CKD   5. Vision loss, right eye    1. CAD in native artery No progressive anginal or exertional symptoms.  Continue aspirin 81 mg daily.  2. Essential hypertension Blood pressure well controlled on current therapy.  Continue amlodipine 10 mg daily, losartan 100 mg daily.  Orders from discharge note from recent hospitalization state get BMP and CBC in 1 week after next doctor's visit.  We will order those lab test.  3. Mixed hyperlipidemia Continue atorvastatin 20 mg daily.  Recent lipid panel TC 117, TG, 50, HDL 31, LDL 56.  4. Stage 3 chronic kidney disease, unspecified whether stage 3a or 3b CKD Recent creatinine 1.19, GFR 54.  5.  Vision loss right eye. Recent vision loss to right x2.  Continues with blurred/foggy vision in right eye.  He had a negative complete eye exam and was sent to ED for further evaluation for possible stroke.  Work-up was negative for CVA.  See above study results in the HPI.  States he does have some mild macular degeneration in his right eye.   Medication Adjustments/Labs and Tests Ordered: Current medicines are reviewed at length with the patient today.  Concerns regarding medicines are outlined above.   Disposition: Follow-up with Dr. Harrington Challenger or APP 6 months. Signed, Levell July, NP 12/31/2019 8:39 AM    [facility] [place], [place], [place] [address] Phone: [phone]; Fax: [phone]

## 2019-12-31 ENCOUNTER — Encounter: Payer: Self-pay | Admitting: Family Medicine

## 2019-12-31 ENCOUNTER — Other Ambulatory Visit: Payer: Self-pay | Admitting: Family Medicine

## 2019-12-31 ENCOUNTER — Ambulatory Visit (INDEPENDENT_AMBULATORY_CARE_PROVIDER_SITE_OTHER): Payer: Medicare Other | Admitting: Family Medicine

## 2019-12-31 VITALS — BP 130/60 | HR 70 | Ht 67.5 in | Wt 201.6 lb

## 2019-12-31 DIAGNOSIS — N183 Chronic kidney disease, stage 3 unspecified: Secondary | ICD-10-CM | POA: Diagnosis not present

## 2019-12-31 DIAGNOSIS — I2510 Atherosclerotic heart disease of native coronary artery without angina pectoris: Secondary | ICD-10-CM

## 2019-12-31 DIAGNOSIS — E782 Mixed hyperlipidemia: Secondary | ICD-10-CM | POA: Diagnosis not present

## 2019-12-31 DIAGNOSIS — I10 Essential (primary) hypertension: Secondary | ICD-10-CM

## 2019-12-31 DIAGNOSIS — H5461 Unqualified visual loss, right eye, normal vision left eye: Secondary | ICD-10-CM

## 2019-12-31 NOTE — Patient Instructions (Addendum)
Medication Instructions:   Your physician recommends that you continue on your current medications as directed. Please refer to the Current Medication list given to you today.  Labwork:  Your physician recommends that you return for non-fasting lab work in: BMET & CBC. This may be done at [facility] or [facility] Monday-Friday from 8:00 am - 4:00 pm. No appointment is needed.  Testing/Procedures:  None  Follow-Up:  Your physician recommends that you schedule a follow-up appointment in: 6 months.  Any Other Special Instructions Will Be Listed Below (If Applicable).  If you need a refill on your cardiac medications before your next appointment, please call your pharmacy.

## 2020-01-01 LAB — BASIC METABOLIC PANEL
BUN/Creatinine Ratio: 19 (ref 10–24)
BUN: 21 mg/dL (ref 8–27)
CO2: 23 mmol/L (ref 20–29)
Calcium: 9.4 mg/dL (ref 8.6–10.2)
Chloride: 96 mmol/L (ref 96–106)
Creatinine, Ser: 1.1 mg/dL (ref 0.76–1.27)
GFR calc Af Amer: 68 mL/min/{1.73_m2} (ref >59)
GFR calc non Af Amer: 59 mL/min/{1.73_m2} — ABNORMAL LOW (ref >59)
Glucose: 194 mg/dL — ABNORMAL HIGH (ref 65–99)
Potassium: 4 mmol/L (ref 3.5–5.2)
Sodium: 134 mmol/L (ref 134–144)

## 2020-01-01 LAB — CBC WITH DIFFERENTIAL/PLATELET
Basophils Absolute: 0.1 10*3/uL (ref 0.0–0.2)
Basos: 1 %
EOS (ABSOLUTE): 0.3 10*3/uL (ref 0.0–0.4)
Eos: 3 %
Hematocrit: 39.3 % (ref 37.5–51.0)
Hemoglobin: 13.6 g/dL (ref 13.0–17.7)
Immature Grans (Abs): 0 10*3/uL (ref 0.0–0.1)
Immature Granulocytes: 0 %
Lymphocytes Absolute: 2.1 10*3/uL (ref 0.7–3.1)
Lymphs: 18 %
MCH: 31.1 pg (ref 26.6–33.0)
MCHC: 34.6 g/dL (ref 31.5–35.7)
MCV: 90 fL (ref 79–97)
Monocytes Absolute: 1.1 10*3/uL — ABNORMAL HIGH (ref 0.1–0.9)
Monocytes: 9 %
Neutrophils Absolute: 7.9 10*3/uL — ABNORMAL HIGH (ref 1.4–7.0)
Neutrophils: 69 %
Platelets: 394 10*3/uL (ref 150–450)
RBC: 4.37 x10E6/uL (ref 4.14–5.80)
RDW: 12.8 % (ref 11.6–15.4)
WBC: 11.5 10*3/uL — ABNORMAL HIGH (ref 3.4–10.8)

## 2020-01-06 DIAGNOSIS — Z8551 Personal history of malignant neoplasm of bladder: Secondary | ICD-10-CM | POA: Diagnosis not present

## 2020-01-06 DIAGNOSIS — N138 Other obstructive and reflux uropathy: Secondary | ICD-10-CM | POA: Diagnosis not present

## 2020-01-06 DIAGNOSIS — C672 Malignant neoplasm of lateral wall of bladder: Secondary | ICD-10-CM | POA: Diagnosis not present

## 2020-01-06 DIAGNOSIS — N401 Enlarged prostate with lower urinary tract symptoms: Secondary | ICD-10-CM | POA: Diagnosis not present

## 2020-01-07 ENCOUNTER — Telehealth: Payer: Self-pay | Admitting: *Deleted

## 2020-01-07 NOTE — Telephone Encounter (Signed)
Laurine Blazer, LPN  1/46/4314 2:76 PM EDT Back to Top    Notified, copy to pcp.

## 2020-01-07 NOTE — Telephone Encounter (Signed)
-----   Message from Verta Ellen., NP sent at 01/07/2020  1:22 PM EDT ----- CBC looked okay.  His white count was up just slightly.  His glucose is 194.  He needs to manage his sugar a little better.  May need to follow-up with his primary Dr. Willey Blade.  Otherwise everything looks okay.

## 2020-02-14 ENCOUNTER — Other Ambulatory Visit (HOSPITAL_COMMUNITY): Payer: Self-pay | Admitting: Family Medicine

## 2020-02-14 ENCOUNTER — Other Ambulatory Visit: Payer: Self-pay | Admitting: Family Medicine

## 2020-02-14 DIAGNOSIS — R748 Abnormal levels of other serum enzymes: Secondary | ICD-10-CM

## 2020-02-24 ENCOUNTER — Ambulatory Visit (HOSPITAL_COMMUNITY)
Admission: RE | Admit: 2020-02-24 | Discharge: 2020-02-24 | Disposition: A | Discharge status: Home / Self Care | Payer: No Typology Code available for payment source | Source: Ambulatory Visit | Attending: Family Medicine | Admitting: Family Medicine

## 2020-02-24 ENCOUNTER — Other Ambulatory Visit: Payer: Self-pay

## 2020-02-24 DIAGNOSIS — R748 Abnormal levels of other serum enzymes: Secondary | ICD-10-CM | POA: Diagnosis not present

## 2020-02-25 DIAGNOSIS — R1312 Dysphagia, oropharyngeal phase: Secondary | ICD-10-CM | POA: Diagnosis not present

## 2020-02-25 DIAGNOSIS — K760 Fatty (change of) liver, not elsewhere classified: Secondary | ICD-10-CM | POA: Diagnosis not present

## 2020-02-25 DIAGNOSIS — R7309 Other abnormal glucose: Secondary | ICD-10-CM | POA: Diagnosis not present

## 2020-02-25 DIAGNOSIS — E1122 Type 2 diabetes mellitus with diabetic chronic kidney disease: Secondary | ICD-10-CM | POA: Diagnosis not present

## 2020-05-20 DIAGNOSIS — E1129 Type 2 diabetes mellitus with other diabetic kidney complication: Secondary | ICD-10-CM | POA: Diagnosis not present

## 2020-05-27 DIAGNOSIS — R7309 Other abnormal glucose: Secondary | ICD-10-CM | POA: Diagnosis not present

## 2020-05-27 DIAGNOSIS — E1122 Type 2 diabetes mellitus with diabetic chronic kidney disease: Secondary | ICD-10-CM | POA: Diagnosis not present

## 2020-05-27 DIAGNOSIS — I10 Essential (primary) hypertension: Secondary | ICD-10-CM | POA: Diagnosis not present

## 2020-07-02 DIAGNOSIS — H353132 Nonexudative age-related macular degeneration, bilateral, intermediate dry stage: Secondary | ICD-10-CM | POA: Diagnosis not present

## 2020-07-02 DIAGNOSIS — Z961 Presence of intraocular lens: Secondary | ICD-10-CM | POA: Diagnosis not present

## 2020-07-02 DIAGNOSIS — E119 Type 2 diabetes mellitus without complications: Secondary | ICD-10-CM | POA: Diagnosis not present

## 2020-07-02 DIAGNOSIS — H43813 Vitreous degeneration, bilateral: Secondary | ICD-10-CM | POA: Diagnosis not present

## 2020-07-16 ENCOUNTER — Other Ambulatory Visit: Payer: Self-pay

## 2020-07-16 ENCOUNTER — Ambulatory Visit (INDEPENDENT_AMBULATORY_CARE_PROVIDER_SITE_OTHER): Payer: Medicare Other | Admitting: Student

## 2020-07-16 ENCOUNTER — Encounter: Payer: Self-pay | Admitting: Student

## 2020-07-16 VITALS — BP 138/60 | HR 72 | Ht 67.5 in | Wt 201.0 lb

## 2020-07-16 DIAGNOSIS — N1831 Chronic kidney disease, stage 3a: Secondary | ICD-10-CM

## 2020-07-16 DIAGNOSIS — I10 Essential (primary) hypertension: Secondary | ICD-10-CM | POA: Diagnosis not present

## 2020-07-16 DIAGNOSIS — I2510 Atherosclerotic heart disease of native coronary artery without angina pectoris: Secondary | ICD-10-CM | POA: Diagnosis not present

## 2020-07-16 DIAGNOSIS — E785 Hyperlipidemia, unspecified: Secondary | ICD-10-CM

## 2020-07-16 MED ORDER — INSULIN GLARGINE 100 UNIT/ML ~~LOC~~ SOLN: 40.0000 [IU] | Freq: Every day | SUBCUTANEOUS | Status: AC

## 2020-07-16 NOTE — Progress Notes (Signed)
Cardiology Office Note    Date:  07/16/2020   ID:  Alexander Clayton, DOB June 17, 1927, MRN [id number]  PCP:  Asencion Noble, MD  Cardiologist: Dorris Carnes, MD    Chief Complaint  Patient presents with  . Follow-up    6 month visit    History of Present Illness:    Alexander Clayton is a 87 y.o. male with past medical history of CAD (s/p Coronary CT in 09/2018 with diffuse CAD and FFR only positive in very distal branches and in the mid portion of a non-dominant LCx artery with medical management recommended), HTN, HLD, Type 2 DM, Stage 3 CKD and bladder cancer (s/p immunotherapy) who presents to the office today for 30-month follow-up.  He was last examined by Katina Dung, NP in 12/2019 following a recent admission for episodes of transient loss of vision of his right eye. He had been evaluated by Ophthalmology with a negative work-up thus far and CT along with Brain MRI were negative during his admission. At the time of his office visit, he denied any recent cardiac symptoms and was continued on his current medication regimen.  In talking with the patient today, he reports overall doing well from a cardiac perspective since his last visit. He denies any recent chest pain or dyspnea on exertion. No recent orthopnea, PND or palpitations.  He does experience intermittent lower extremity edema but remains on Lozol per Urology. He performs all of the chores around his home and is also the primary caretaker for his wife.  He does report Ophthalmology increased his ASA to 81 mg twice daily following his episode in 11/2019.  His vision has improved in his right eye but is not back to baseline.    Past Medical History:  Diagnosis Date  . Bladder cancer ([place])   . CAD (coronary artery disease)    a. per coronary CTA 09/2018 - managed medically.  . CKD (chronic kidney disease), stage III ([place])   . Diabetes mellitus without complication ([place])   . Hepatic steatosis    by CT 08/2018  . Hiatal hernia     small by CT 08/2018  . Hypercholesteremia   . Hypertension     Past Surgical History:  Procedure Laterality Date  . APPENDECTOMY    . HERNIA REPAIR      Current Medications: Outpatient Medications Prior to Visit  Medication Sig Dispense Refill  . acetaminophen (TYLENOL) 500 MG tablet Take 1,000 mg by mouth 3 (three) times daily.     Marland Kitchen amLODipine (NORVASC) 10 MG tablet Take 10 mg by mouth every morning.     Marland Kitchen aspirin EC 81 MG tablet Take 81 mg by mouth in the morning and at bedtime.    Marland Kitchen atorvastatin (LIPITOR) 20 MG tablet Take 20 mg by mouth at bedtime.     . cetirizine (ZYRTEC) 10 MG tablet Take 10 mg by mouth at bedtime.     . gabapentin (NEURONTIN) 300 MG capsule Take 300 mg by mouth 2 (two) times a day.    . indapamide (LOZOL) 2.5 MG tablet Take 2.5 mg by mouth every morning.     Marland Kitchen levothyroxine (SYNTHROID) 75 MCG tablet Take 75 mcg by mouth daily before breakfast.    . losartan (COZAAR) 100 MG tablet Take 100 mg by mouth every morning.    . metFORMIN (GLUCOPHAGE) 500 MG tablet Take 500 mg by mouth 2 (two) times daily.     . Multiple Vitamin (MULTIVITAMIN) capsule Take 1 capsule  by mouth at bedtime.     . Multiple Vitamins-Minerals (ICAPS AREDS 2 PO) Take 1 tablet by mouth 2 (two) times a day.     . Potassium Chloride ER 20 MEQ TBCR Take 1 tablet by mouth daily.    Marland Kitchen glipiZIDE (GLUCOTROL) 5 MG tablet Take 5 mg by mouth every morning.     . insulin glargine (LANTUS) 100 UNIT/ML injection Inject 35 Units into the skin daily.      No facility-administered medications prior to visit.     Allergies:   Ciprofloxacin and Vioxx [rofecoxib]   Social History   Socioeconomic History  . Marital status: Married    Spouse name: Not on file  . Number of children: Not on file  . Years of education: Not on file  . Highest education level: Not on file  Occupational History  . Not on file  Tobacco Use  . Smoking status: Never Smoker  . Smokeless tobacco: Never Used  Vaping Use  .  Vaping Use: Never used  Substance and Sexual Activity  . Alcohol use: No    Alcohol/week: 0.0 standard drinks  . Drug use: No  . Sexual activity: Not on file  Other Topics Concern  . Not on file  Social History Narrative  . Not on file   Social Determinants of Health   Financial Resource Strain: Not on file  Food Insecurity: Not on file  Transportation Needs: Not on file  Physical Activity: Not on file  Stress: Not on file  Social Connections: Not on file     Family History:  The patient's family history includes Cirrhosis in his brother; Diabetes Mellitus II in his brother; Hypertension in his father.   Review of Systems:   Please see the history of present illness.     General:  No chills, fever, night sweats or weight changes.  Cardiovascular:  No chest pain, dyspnea on exertion, edema, orthopnea, palpitations, paroxysmal nocturnal dyspnea. Dermatological: No rash, lesions/masses Respiratory: No cough, dyspnea Urologic: No hematuria, dysuria Abdominal:   No nausea, vomiting, diarrhea, bright red blood per rectum, melena, or hematemesis Neurologic:  No wkns, changes in mental status. Positive for vision changes.   All other systems reviewed and are otherwise negative except as noted above.   Physical Exam:    VS:  BP 138/60   Pulse 72   Ht 5' 7.5" (1.715 m)   Wt 201 lb (91.2 kg)   SpO2 96%   BMI 31.02 kg/m    General: Well developed, elderly male appearing in no acute distress. Head: Normocephalic, atraumatic. Neck: No carotid bruits. JVD not elevated.  Lungs: Respirations regular and unlabored, without wheezes or rales.  Heart: Regular rate and rhythm. No S3 or S4.  No murmur, no rubs, or gallops appreciated. Abdomen: Appears non-distended. No obvious abdominal masses. Msk:  Strength and tone appear normal for age. No obvious joint deformities or effusions. Extremities: No clubbing or cyanosis. Trace ankle edema bilaterally. Distal pedal pulses are 2+  bilaterally. Neuro: Alert and oriented X 3. Moves all extremities spontaneously. No focal deficits noted. Psych:  Responds to questions appropriately with a normal affect. Skin: No rashes or lesions noted  Wt Readings from Last 3 Encounters:  07/16/20 201 lb (91.2 kg)  12/31/19 201 lb 9.6 oz (91.4 kg)  12/17/19 199 lb 8.3 oz (90.5 kg)     Studies/Labs Reviewed:   EKG:  EKG is not ordered today.    Recent Labs: 12/16/2019: ALT 19 12/31/2019: BUN 21;  Creatinine, Ser 1.10; Hemoglobin 13.6; Platelets 394; Potassium 4.0; Sodium 134   Lipid Panel    Component Value Date/Time   CHOL 117 12/17/2019 0520   TRIG 150 (H) 12/17/2019 0520   HDL 31 (L) 12/17/2019 0520   CHOLHDL 3.8 12/17/2019 0520   VLDL 30 12/17/2019 0520   LDLCALC 56 12/17/2019 0520    Additional studies/ records that were reviewed today include:   Coronary CT: 09/2018 IMPRESSION: 1. Coronary calcium score of 3780. This was 26 percentile for age and sex matched control.  2. Normal coronary origin with right dominance.  3. Moderate to severe diffuse calcified plaque with moderate stenosis in the mid and distal RCA, proximal LAD, proximal portion of ramus intermedius and proximal and mid portion of non-dominant LCX artery. Additional analysis with CT FFR will be submitted.  1. Left Main: 0.98. 2. LAD: Proximal: 0.96.  Mid: 0.85.  Distal 0.77. 3. Ramus intermedius: Proximal: 0.93.  Mid: 0.80.  Distal: 0.76. 4. LCX: Proximal: 0.97.  Mid: 0.84.  Distal: 0.68. 5. RCA: Proximal: 0.99.  Mid: 0.90.  Distal: 0.80.  IMPRESSION: 1. CT FFR analysis showed stenoses in very portions of LAD, ramus intermedius and in the mid portion of a non-dominant LCX artery. Aggressive medical management is recommended.  Echocardiogram: 11/2019 IMPRESSIONS    1. Left ventricular ejection fraction, by estimation, is 55 to 60%. The  left ventricle has normal function. The left ventricle has no regional  wall motion abnormalities.  There is mild left ventricular hypertrophy.  Left ventricular diastolic parameters  are consistent with Grade I diastolic dysfunction (impaired relaxation).  2. Right ventricular systolic function is normal. The right ventricular  size is normal.  3. The mitral valve is normal in structure. No evidence of mitral valve  regurgitation. No evidence of mitral stenosis.  4. The aortic valve was not well visualized. Aortic valve regurgitation  is not visualized. No aortic stenosis is present.  5. The inferior vena cava is normal in size with greater than 50%  respiratory variability, suggesting right atrial pressure of 3 mmHg.   Assessment:    1. Coronary artery disease involving native coronary artery of native heart without angina pectoris   2. Essential hypertension   3. Hyperlipidemia LDL goal <70   4. Stage 3a chronic kidney disease ([place])      Plan:   In order of problems listed above:  1. CAD - He is s/p Coronary CT in 09/2018 with diffuse CAD and FFR only positive in very distal branches and in the mid portion of a non-dominant LCx artery with medical management recommended. - He denies any recent chest pain and his breathing has been stable.  - Continue current medication regimen with ASA 81mg  daily and Atorvastatin 20mg  daily.   2. HTN - BP is well-controlled at 138/60 during today's visit. Continue current medication regimen with Amlodipine 10mg  daily and Losartan 100mg  daily.   3. HLD - FLP in 11/2019 showed total cholesterol of 117, Triglycerides 150, HDL 31 and LDL 56. At goal of LDL less than 70. Continue Atorvastatin 20mg  daily.   4. Stage 3 CKD - Creatinine stable at 1.10 by labs in 12/2019. Remains on Lozol 2.5mg  daily.    Medication Adjustments/Labs and Tests Ordered: Current medicines are reviewed at length with the patient today.  Concerns regarding medicines are outlined above.  Medication changes, Labs and Tests ordered today are listed in the Patient  Instructions below. Patient Instructions  Medication Instructions:  Your physician recommends that  you continue on your current medications as directed. Please refer to the Current Medication list given to you today.  *If you need a refill on your cardiac medications before your next appointment, please call your pharmacy*   Lab Work: None  If you have labs (blood work) drawn today and your tests are completely normal, you will receive your results only by: Marland Kitchen MyChart Message (if you have MyChart) OR . A paper copy in the mail If you have any lab test that is abnormal or we need to change your treatment, we will call you to review the results.   Testing/Procedures: None    Follow-Up: At [facility], you and your health needs are our priority.  As part of our continuing mission to provide you with exceptional heart care, we have created designated Provider Care Teams.  These Care Teams include your primary Cardiologist (physician) and Advanced Practice Providers (APPs -  Physician Assistants and Nurse Practitioners) who all work together to provide you with the care you need, when you need it.  We recommend signing up for the patient portal called "MyChart".  Sign up information is provided on this After Visit Summary.  MyChart is used to connect with patients for Virtual Visits (Telemedicine).  Patients are able to view lab/test results, encounter notes, upcoming appointments, etc.  Non-urgent messages can be sent to your provider as well.   To learn more about what you can do with MyChart, go to [email or web address].    Your next appointment:   12 month(s)  The format for your next appointment:   In Person  Provider:   You may see Dorris Carnes, MD or one of the following Advanced Practice Providers on your designated Care Team:    Bernerd Pho, PA-C       Other Instructions None      Thank you for choosing [facility]  !            Signed, Erma Heritage, PA-C  07/16/2020 8:14 PM    [place] [address] [place], [place] [address] Phone: [phone] Fax: [phone]

## 2020-07-16 NOTE — Patient Instructions (Signed)
Medication Instructions:  Your physician recommends that you continue on your current medications as directed. Please refer to the Current Medication list given to you today.  *If you need a refill on your cardiac medications before your next appointment, please call your pharmacy*   Lab Work: None  If you have labs (blood work) drawn today and your tests are completely normal, you will receive your results only by: [facility] MyChart Message (if you have MyChart) OR . A paper copy in the mail If you have any lab test that is abnormal or we need to change your treatment, we will call you to review the results.   Testing/Procedures: None    Follow-Up: At [facility], you and your health needs are our priority.  As part of our continuing mission to provide you with exceptional heart care, we have created designated Provider Care Teams.  These Care Teams include your primary Cardiologist (physician) and Advanced Practice Providers (APPs -  Physician Assistants and Nurse Practitioners) who all work together to provide you with the care you need, when you need it.  We recommend signing up for the patient portal called "MyChart".  Sign up information is provided on this After Visit Summary.  MyChart is used to connect with patients for Virtual Visits (Telemedicine).  Patients are able to view lab/test results, encounter notes, upcoming appointments, etc.  Non-urgent messages can be sent to your provider as well.   To learn more about what you can do with MyChart, go to [email or web address].    Your next appointment:   12 month(s)  The format for your next appointment:   In Person  Provider:   You may see Dorris Carnes, MD or one of the following Advanced Practice Providers on your designated Care Team:    Bernerd Pho, PA-C       Other Instructions None      Thank you for choosing [facility] !

## 2020-08-17 DIAGNOSIS — E1129 Type 2 diabetes mellitus with other diabetic kidney complication: Secondary | ICD-10-CM | POA: Diagnosis not present

## 2020-08-24 DIAGNOSIS — I10 Essential (primary) hypertension: Secondary | ICD-10-CM | POA: Diagnosis not present

## 2020-08-24 DIAGNOSIS — E1122 Type 2 diabetes mellitus with diabetic chronic kidney disease: Secondary | ICD-10-CM | POA: Diagnosis not present

## 2020-08-24 DIAGNOSIS — R7309 Other abnormal glucose: Secondary | ICD-10-CM | POA: Diagnosis not present

## 2020-10-09 DIAGNOSIS — H353132 Nonexudative age-related macular degeneration, bilateral, intermediate dry stage: Secondary | ICD-10-CM | POA: Diagnosis not present

## 2020-11-26 DIAGNOSIS — E1129 Type 2 diabetes mellitus with other diabetic kidney complication: Secondary | ICD-10-CM | POA: Diagnosis not present

## 2020-12-03 DIAGNOSIS — N183 Chronic kidney disease, stage 3 unspecified: Secondary | ICD-10-CM | POA: Diagnosis not present

## 2020-12-03 DIAGNOSIS — I700 Atherosclerosis of aorta: Secondary | ICD-10-CM | POA: Diagnosis not present

## 2020-12-03 DIAGNOSIS — I10 Essential (primary) hypertension: Secondary | ICD-10-CM | POA: Diagnosis not present

## 2020-12-03 DIAGNOSIS — E1122 Type 2 diabetes mellitus with diabetic chronic kidney disease: Secondary | ICD-10-CM | POA: Diagnosis not present

## 2021-01-14 DIAGNOSIS — R972 Elevated prostate specific antigen [PSA]: Secondary | ICD-10-CM | POA: Diagnosis not present

## 2021-01-14 DIAGNOSIS — R800 Isolated proteinuria: Secondary | ICD-10-CM | POA: Diagnosis not present

## 2021-01-14 DIAGNOSIS — Z8551 Personal history of malignant neoplasm of bladder: Secondary | ICD-10-CM | POA: Diagnosis not present

## 2021-01-26 IMAGING — CT CT ANGIOGRAPHY CHEST
2 of 6 series · 17 of 46 positions shown · IV contrast (Isovue)
Comparison: Chest radiograph September 03, 2018.

CLINICAL DATA: Chest pain and shortness of breath. Positive D-dimer
study

EXAM:
CT ANGIOGRAPHY CHEST WITH CONTRAST
TECHNIQUE: Multidetector CT imaging of the chest was performed using the
standard protocol during bolus administration of intravenous
contrast. Multiplanar CT image reconstructions and MIPs were
obtained to evaluate the vascular anatomy.
CONTRAST:  100mL OMNIPAQUE IOHEXOL 350 MG/ML SOLN

[Series 5: thins · axial · 0.71mm/px · z∈[-68,+183]mm · 14 of 275 slices shown]
[im 12/275  lung]
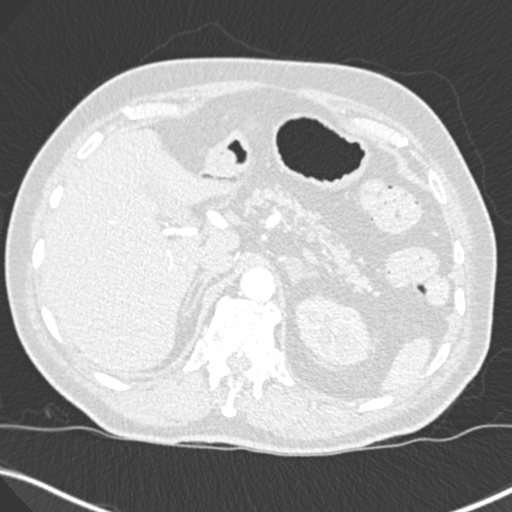
[im 36/275  soft-tissue]
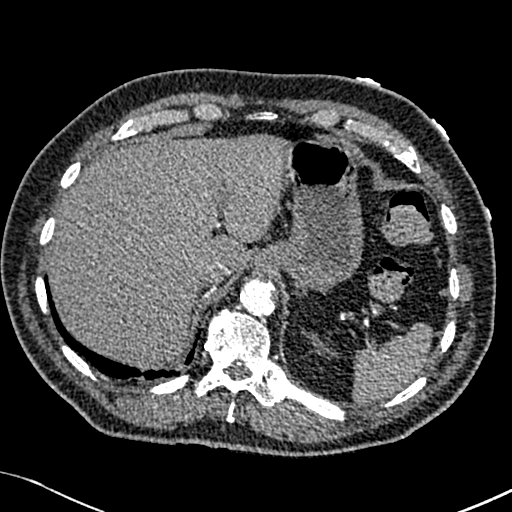
[im 48/275  lung]
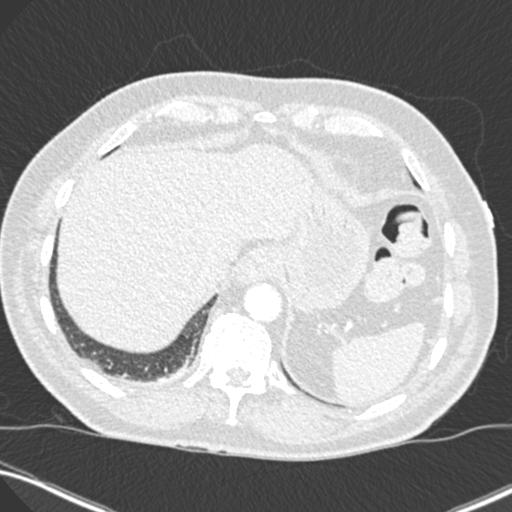
[im 72/275  soft-tissue]
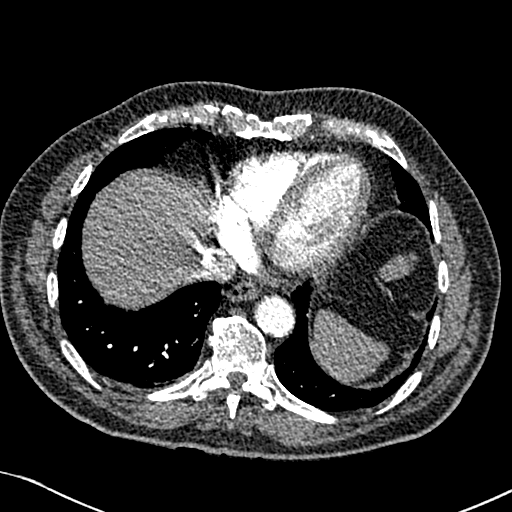
[im 96/275  lung]
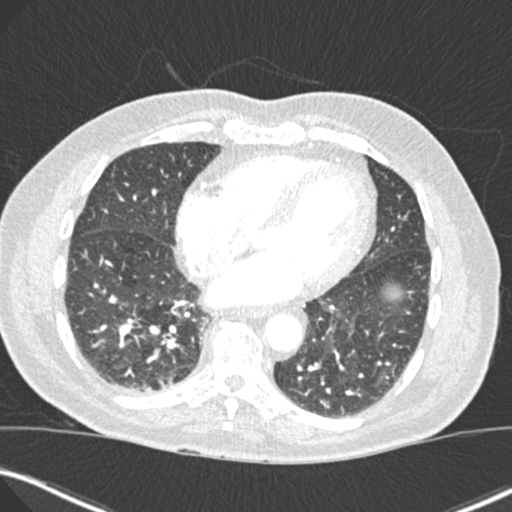
[im 108/275  soft-tissue]
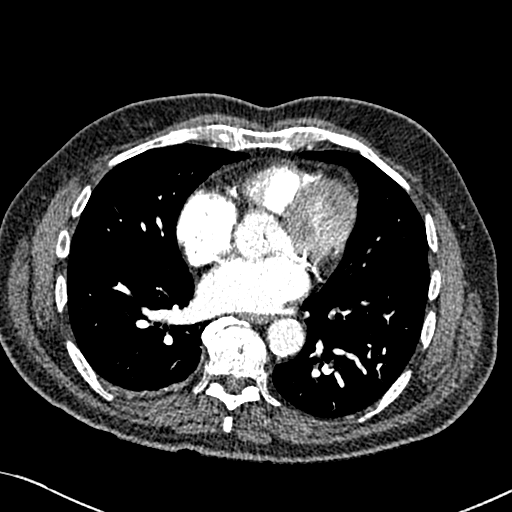
[im 132/275  lung]
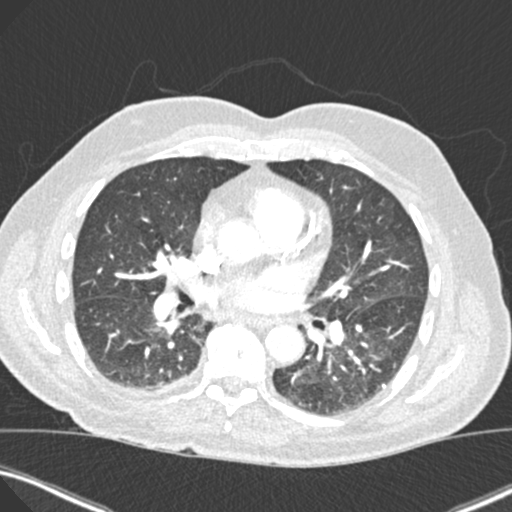
[im 143/275  soft-tissue]
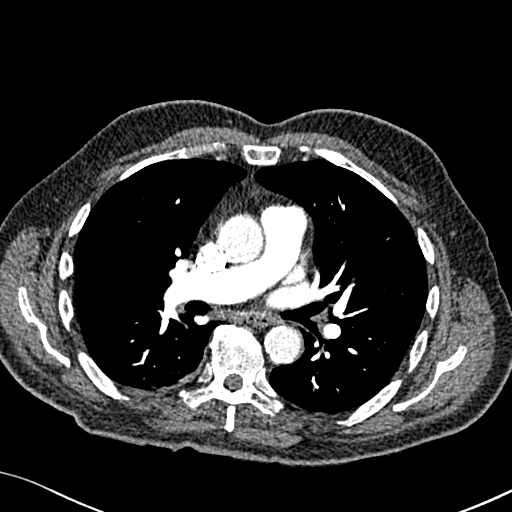
[im 167/275  lung]
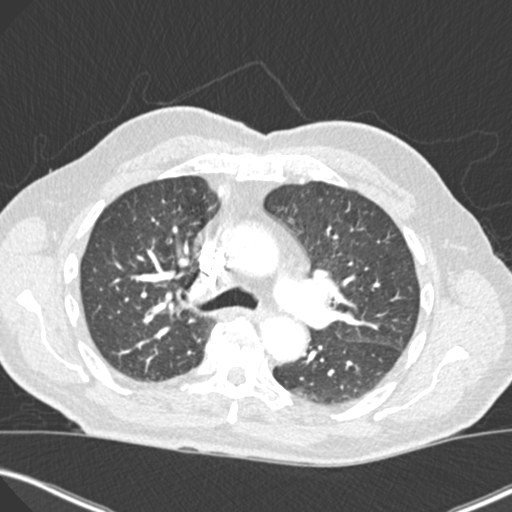
[im 179/275  soft-tissue]
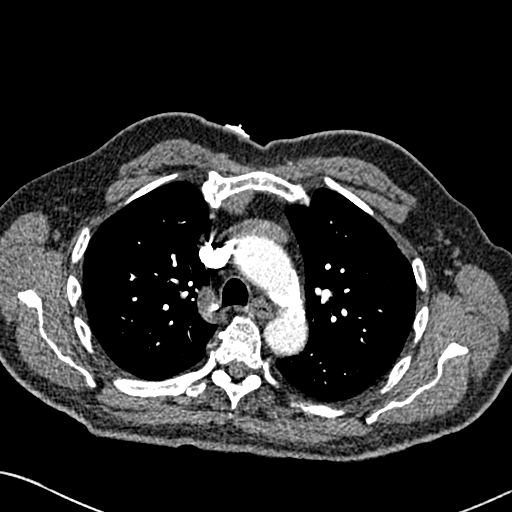
[im 203/275  lung]
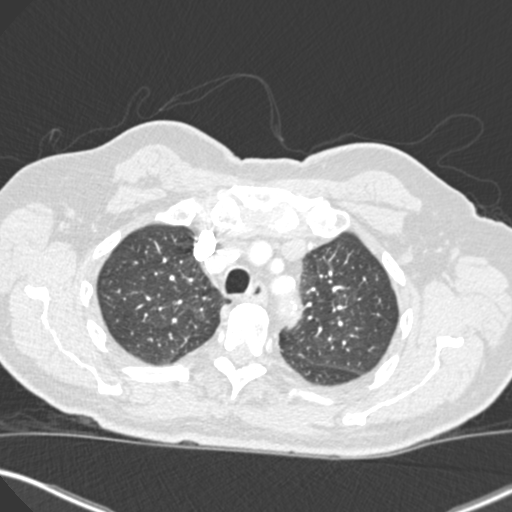
[im 227/275  soft-tissue]
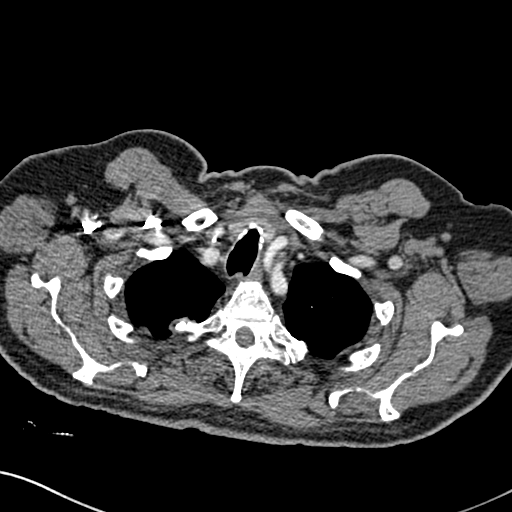
[im 239/275  lung]
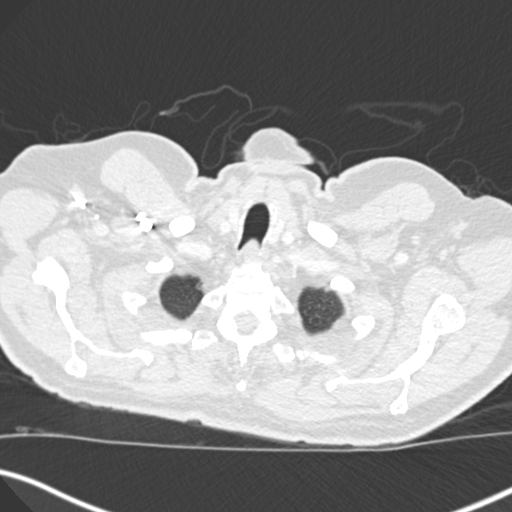
[im 263/275  soft-tissue]
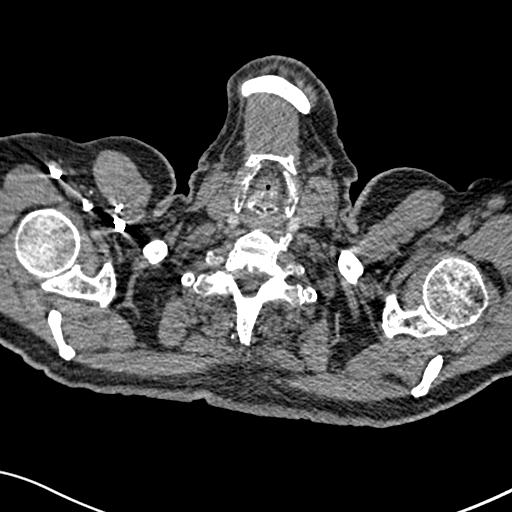

[Series 7: coronal mpr · coronal · 0.57mm/px · 3 of 151 slices shown]
[im 38/151  soft-tissue]
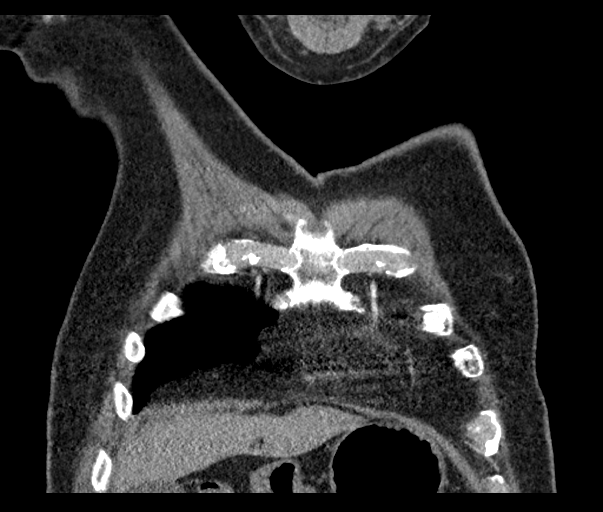
[im 76/151  soft-tissue]
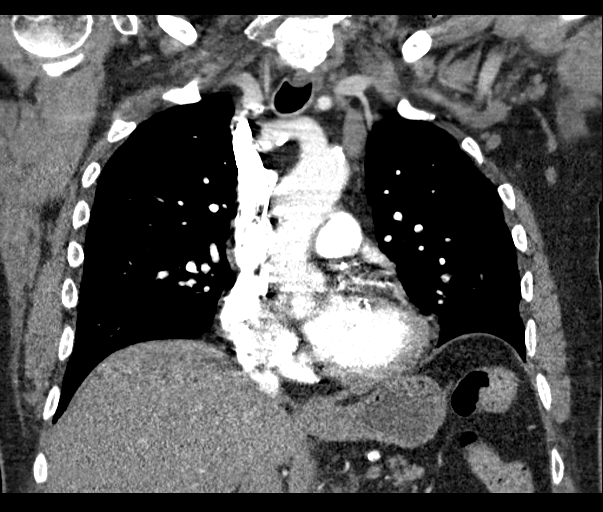
[im 113/151  soft-tissue]
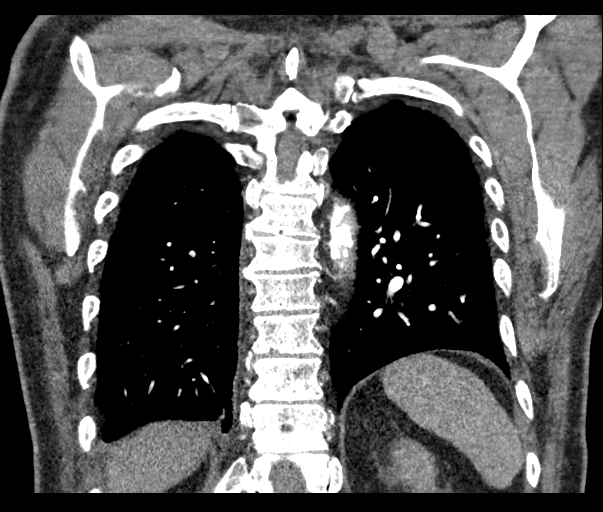

[17 of 46 positions shown; findings below may reference images not displayed]

FINDINGS: Cardiovascular: There is no demonstrable pulmonary embolus. There is
no appreciable thoracic aortic aneurysm or dissection. Visualized
great vessels appear normal except for scattered areas of
calcification. There are foci of aortic atherosclerosis. There are
multiple foci of coronary artery calcification. There is no
pericardial effusion or pericardial thickening evident.

Mediastinum/Nodes: Thyroid appears unremarkable. There is no
appreciable thoracic adenopathy. There is a small hiatal hernia.

Lungs/Pleura: There is a minimal right pleural effusion. There are
foci of atelectatic change in each lower lobe.

There is a focal area of opacity in the posterior aspect of the
apical segment of the right upper lobe which contains calcification
and abuts the pleura. This lesion, measuring 2.3 x 1.6 x 1.2 cm, may
well represent a focal granuloma. A similar appearing structure is
noted in the posterior segment right upper lobe abutting the pleura
on axial slice 33 series 6, measuring 6 x 5 x 5 mm.

Upper Abdomen: There is a degree of hepatic steatosis. There is an
apparent adenoma in the left adrenal measuring 1.1 x 1.0 cm.
Visualized upper abdominal structures otherwise appear unremarkable.

Musculoskeletal: There is degenerative change in the thoracic spine
with diffuse idiopathic skeletal hyperostosis. There is anterior
wedging of several mid and lower thoracic vertebral bodies. There
are no blastic or lytic bone lesions. No chest wall lesions are
evident.

Review of the MIP images confirms the above findings.
IMPRESSION: 1. No demonstrable pulmonary embolus. No thoracic aortic aneurysm or
dissection. There are foci of aortic atherosclerosis as well as foci
great vessel and coronary artery calcification.

2. Probable granulomas in the right upper lobe, largest measuring
slightly greater than 2 cm in maximum transverse diameter. There are
areas of mild atelectatic change without edema or consolidation.
Minimal right pleural effusion noted.

3.  Small hiatal hernia.

4.  No evident thoracic adenopathy.

5.  Hepatic steatosis.

6.  Benign left adrenal adenoma.

7. Diffuse idiopathic skeletal hyperostosis in the thoracic spine.
Anterior wedging of several thoracic vertebral bodies noted.

Aortic Atherosclerosis ([id number]).

## 2021-01-28 DIAGNOSIS — Z23 Encounter for immunization: Secondary | ICD-10-CM | POA: Diagnosis not present

## 2021-03-15 DIAGNOSIS — E1129 Type 2 diabetes mellitus with other diabetic kidney complication: Secondary | ICD-10-CM | POA: Diagnosis not present

## 2021-03-24 DIAGNOSIS — E785 Hyperlipidemia, unspecified: Secondary | ICD-10-CM | POA: Diagnosis not present

## 2021-03-24 DIAGNOSIS — E1122 Type 2 diabetes mellitus with diabetic chronic kidney disease: Secondary | ICD-10-CM | POA: Diagnosis not present

## 2021-03-24 DIAGNOSIS — R7301 Impaired fasting glucose: Secondary | ICD-10-CM | POA: Diagnosis not present

## 2021-03-24 DIAGNOSIS — I10 Essential (primary) hypertension: Secondary | ICD-10-CM | POA: Diagnosis not present

## 2021-03-31 DIAGNOSIS — H524 Presbyopia: Secondary | ICD-10-CM | POA: Diagnosis not present

## 2021-06-16 DIAGNOSIS — C679 Malignant neoplasm of bladder, unspecified: Secondary | ICD-10-CM | POA: Diagnosis not present

## 2021-06-16 DIAGNOSIS — I10 Essential (primary) hypertension: Secondary | ICD-10-CM | POA: Diagnosis not present

## 2021-06-16 DIAGNOSIS — E785 Hyperlipidemia, unspecified: Secondary | ICD-10-CM | POA: Diagnosis not present

## 2021-06-16 DIAGNOSIS — E119 Type 2 diabetes mellitus without complications: Secondary | ICD-10-CM | POA: Diagnosis not present

## 2021-06-23 DIAGNOSIS — I10 Essential (primary) hypertension: Secondary | ICD-10-CM | POA: Diagnosis not present

## 2021-06-23 DIAGNOSIS — E1122 Type 2 diabetes mellitus with diabetic chronic kidney disease: Secondary | ICD-10-CM | POA: Diagnosis not present

## 2021-06-23 DIAGNOSIS — I2510 Atherosclerotic heart disease of native coronary artery without angina pectoris: Secondary | ICD-10-CM | POA: Diagnosis not present

## 2021-09-08 DIAGNOSIS — L570 Actinic keratosis: Secondary | ICD-10-CM | POA: Diagnosis not present

## 2021-09-08 DIAGNOSIS — L821 Other seborrheic keratosis: Secondary | ICD-10-CM | POA: Diagnosis not present

## 2021-09-08 DIAGNOSIS — L308 Other specified dermatitis: Secondary | ICD-10-CM | POA: Diagnosis not present

## 2021-09-08 DIAGNOSIS — B078 Other viral warts: Secondary | ICD-10-CM | POA: Diagnosis not present

## 2021-09-16 DIAGNOSIS — E1129 Type 2 diabetes mellitus with other diabetic kidney complication: Secondary | ICD-10-CM | POA: Diagnosis not present

## 2021-09-23 DIAGNOSIS — I10 Essential (primary) hypertension: Secondary | ICD-10-CM | POA: Diagnosis not present

## 2021-09-23 DIAGNOSIS — R7309 Other abnormal glucose: Secondary | ICD-10-CM | POA: Diagnosis not present

## 2021-09-23 DIAGNOSIS — M1712 Unilateral primary osteoarthritis, left knee: Secondary | ICD-10-CM | POA: Diagnosis not present

## 2021-09-23 DIAGNOSIS — E1122 Type 2 diabetes mellitus with diabetic chronic kidney disease: Secondary | ICD-10-CM | POA: Diagnosis not present

## 2021-11-17 ENCOUNTER — Telehealth: Payer: Self-pay | Admitting: Orthopedic Surgery

## 2021-11-17 NOTE — Telephone Encounter (Signed)
Voice message received from patient requesting to see our knee doctor; call returned; reached voice mail, left message.

## 2021-11-18 ENCOUNTER — Telehealth: Payer: Self-pay

## 2021-11-18 NOTE — Telephone Encounter (Signed)
Patient called wanting an appointment with Dr. Aline Brochure. He said that he was seen by [facility] in [place] and they won't do anything for him, so he would like a second opinion. I explained our protocol for second opinions and gave him our fax number and address to give to [facility], patient stated he understood everything he was told.

## 2021-11-22 NOTE — Telephone Encounter (Signed)
Noted, once notes and images received we can have Dr Aline Brochure review and advise on appt.

## 2022-01-19 ENCOUNTER — Encounter: Payer: Self-pay | Admitting: Orthopedic Surgery

## 2022-01-20 DIAGNOSIS — Z8551 Personal history of malignant neoplasm of bladder: Secondary | ICD-10-CM | POA: Diagnosis not present

## 2022-01-20 DIAGNOSIS — N401 Enlarged prostate with lower urinary tract symptoms: Secondary | ICD-10-CM | POA: Diagnosis not present

## 2022-01-20 DIAGNOSIS — R972 Elevated prostate specific antigen [PSA]: Secondary | ICD-10-CM | POA: Diagnosis not present

## 2022-01-20 DIAGNOSIS — R800 Isolated proteinuria: Secondary | ICD-10-CM | POA: Diagnosis not present

## 2022-01-21 DIAGNOSIS — E1122 Type 2 diabetes mellitus with diabetic chronic kidney disease: Secondary | ICD-10-CM | POA: Diagnosis not present

## 2022-01-21 DIAGNOSIS — Z23 Encounter for immunization: Secondary | ICD-10-CM | POA: Diagnosis not present

## 2022-01-21 DIAGNOSIS — M1712 Unilateral primary osteoarthritis, left knee: Secondary | ICD-10-CM | POA: Diagnosis not present

## 2022-01-21 DIAGNOSIS — E785 Hyperlipidemia, unspecified: Secondary | ICD-10-CM | POA: Diagnosis not present

## 2022-01-21 DIAGNOSIS — I10 Essential (primary) hypertension: Secondary | ICD-10-CM | POA: Diagnosis not present

## 2022-01-25 DIAGNOSIS — H3530 Unspecified macular degeneration: Secondary | ICD-10-CM | POA: Insufficient documentation

## 2022-01-25 DIAGNOSIS — E039 Hypothyroidism, unspecified: Secondary | ICD-10-CM | POA: Insufficient documentation

## 2022-01-27 ENCOUNTER — Ambulatory Visit (INDEPENDENT_AMBULATORY_CARE_PROVIDER_SITE_OTHER): Payer: Medicare Other | Admitting: Orthopedic Surgery

## 2022-01-27 ENCOUNTER — Encounter: Payer: Self-pay | Admitting: Orthopedic Surgery

## 2022-01-27 ENCOUNTER — Ambulatory Visit (INDEPENDENT_AMBULATORY_CARE_PROVIDER_SITE_OTHER): Payer: Medicare Other

## 2022-01-27 VITALS — BP 159/74 | HR 75 | Ht 67.5 in | Wt 201.0 lb

## 2022-01-27 DIAGNOSIS — M25562 Pain in left knee: Secondary | ICD-10-CM

## 2022-01-27 DIAGNOSIS — M79662 Pain in left lower leg: Secondary | ICD-10-CM | POA: Diagnosis not present

## 2022-01-27 DIAGNOSIS — M5432 Sciatica, left side: Secondary | ICD-10-CM

## 2022-01-27 DIAGNOSIS — G8929 Other chronic pain: Secondary | ICD-10-CM

## 2022-01-27 DIAGNOSIS — M1712 Unilateral primary osteoarthritis, left knee: Secondary | ICD-10-CM

## 2022-01-27 MED ORDER — METHYLPREDNISOLONE ACETATE 40 MG/ML IJ SUSP
40.0000 mg | Freq: Once | INTRAMUSCULAR | Status: AC
  Administered 2022-01-27: 40 mg via INTRA_ARTICULAR

## 2022-01-27 NOTE — Progress Notes (Signed)
New patient  Patient requested second opinion regarding his left knee  Patient followed by [facility] for several years but over the last 7 years has basically been getting knee injections including cortisone and hyaluronic acid  He became unhappy with the PA and requested a transfer of care and second opinion  He has coronary artery disease, history of bladder cancer, stage III kidney disease diabetes hypertension  He complains of occasional left lower back pain with left-sided sciatica which is relieved by lying on his back and flexing his legs and elevating them  His left knee is noted to have chronic pain but he is not a surgical candidate at the age of 88 and with the medical problems as described  I was able to review his imaging he has grade 4 disease in his left knee on x-rays dated 2023 and grade 3 disease in the right knee  He has had viscosupplementation injections most recently.  BP (!) 159/74   Pulse 75   Ht 5' 7.5" (1.715 m)   Wt 201 lb (91.2 kg)   BMI 31.02 kg/m   He is alert awake and oriented x3 in a very spry 88  General appearance is normal well-groomed no obvious deformities  His left lower extremities show peripheral edema pitting mild  His left knee range of motion is 10-85 He has a small effusion The medial joint line is tender The ligaments are stable The muscle tone is normal  His left shin approximidshaft has a bruise and is tender there but is below the Pez anserine bursa  He walks without assistive device there is no obvious limp  External images again I have read the AP of both knees and the lateral and patellofemoral view of the left knee on the CD he has grade 4 disease on the left and grade 3 disease on the right   Our internal mages of his left tibia and fibula show no bone lesion in the area in question  Assessment  88 year old male probably not a surgical candidate was receiving good care but became unhappy with the  PA at [facility] transfer of care here second opinion he really just wants an injection which we gave him  No treatment needed for his chronic sciatica as he has managed to use rest and activity modification position changes to alleviate that  Procedure  Procedure note left knee injection   verbal consent was obtained to inject left knee joint  Timeout was completed to confirm the site of injection  The medications used were depomedrol 40 mg and 1% lidocaine 3 cc Anesthesia was provided by ethyl chloride and the skin was prepped with alcohol.  After cleaning the skin with alcohol a 20-gauge needle was used to inject the left knee joint. There were no complications. A sterile bandage was applied.    He can return for injections as needed  Encounter Diagnoses  Name Primary?   Pain in left lower leg Yes   Primary osteoarthritis of left knee    Chronic pain of left knee    Sciatica, left side

## 2022-03-10 ENCOUNTER — Encounter: Payer: Self-pay | Admitting: Orthopaedic Surgery

## 2022-03-10 ENCOUNTER — Ambulatory Visit (INDEPENDENT_AMBULATORY_CARE_PROVIDER_SITE_OTHER): Payer: Medicare Other | Admitting: Orthopaedic Surgery

## 2022-03-10 DIAGNOSIS — M1712 Unilateral primary osteoarthritis, left knee: Secondary | ICD-10-CM

## 2022-03-10 MED ORDER — METHYLPREDNISOLONE ACETATE 40 MG/ML IJ SUSP
40.0000 mg | Freq: Once | INTRAMUSCULAR | Status: AC
  Administered 2022-03-10: 40 mg via INTRA_ARTICULAR

## 2022-03-10 NOTE — Progress Notes (Signed)
PROCEDURE NOTE:  The patient requests injections of the left knee , verbal consent was obtained.  The left knee was prepped appropriately after time out was performed.   Sterile technique was observed and injection of 1 cc of DepoMedrol 40 mg with several cc's of plain xylocaine. Anesthesia was provided by ethyl chloride and a 20-gauge needle was used to inject the knee area. The injection was tolerated well.  A band aid dressing was applied.  The patient was advised to apply ice later today and tomorrow to the injection sight as needed.  Encounter Diagnosis  Name Primary?   Primary osteoarthritis of left knee Yes   Return prn.  Call if any problem.  Precautions discussed.  Electronically Signed Sanjuana Kava, MD 11/16/20238:02 AM

## 2022-04-13 ENCOUNTER — Ambulatory Visit (INDEPENDENT_AMBULATORY_CARE_PROVIDER_SITE_OTHER): Payer: Medicare Other | Admitting: Orthopaedic Surgery

## 2022-04-13 ENCOUNTER — Encounter: Payer: Self-pay | Admitting: Orthopaedic Surgery

## 2022-04-13 VITALS — Ht 67.5 in | Wt 200.0 lb

## 2022-04-13 DIAGNOSIS — M25562 Pain in left knee: Secondary | ICD-10-CM | POA: Diagnosis not present

## 2022-04-13 DIAGNOSIS — M1712 Unilateral primary osteoarthritis, left knee: Secondary | ICD-10-CM

## 2022-04-13 DIAGNOSIS — G8929 Other chronic pain: Secondary | ICD-10-CM | POA: Diagnosis not present

## 2022-04-13 MED ORDER — METHYLPREDNISOLONE ACETATE 40 MG/ML IJ SUSP
40.0000 mg | Freq: Once | INTRAMUSCULAR | Status: AC
  Administered 2022-04-13: 40 mg via INTRA_ARTICULAR

## 2022-04-13 NOTE — Progress Notes (Signed)
PROCEDURE NOTE:  The patient requests injections of the left knee , verbal consent was obtained.  The left knee was prepped appropriately after time out was performed.   Sterile technique was observed and injection of 1 cc of DepoMedrol 40 mg with several cc's of plain xylocaine. Anesthesia was provided by ethyl chloride and a 20-gauge needle was used to inject the knee area. The injection was tolerated well.  A band aid dressing was applied.  The patient was advised to apply ice later today and tomorrow to the injection sight as needed.  Encounter Diagnoses  Name Primary?   Primary osteoarthritis of left knee Yes   Chronic pain of left knee    Return prn.  Call if any problem.  Precautions discussed.  Electronically Signed Sanjuana Kava, MD 12/20/20239:27 AM

## 2022-04-20 DIAGNOSIS — E1129 Type 2 diabetes mellitus with other diabetic kidney complication: Secondary | ICD-10-CM | POA: Diagnosis not present

## 2022-04-27 DIAGNOSIS — I10 Essential (primary) hypertension: Secondary | ICD-10-CM | POA: Diagnosis not present

## 2022-04-27 DIAGNOSIS — R7309 Other abnormal glucose: Secondary | ICD-10-CM | POA: Diagnosis not present

## 2022-04-27 DIAGNOSIS — E785 Hyperlipidemia, unspecified: Secondary | ICD-10-CM | POA: Diagnosis not present

## 2022-04-27 DIAGNOSIS — E1122 Type 2 diabetes mellitus with diabetic chronic kidney disease: Secondary | ICD-10-CM | POA: Diagnosis not present

## 2022-04-27 DIAGNOSIS — M1712 Unilateral primary osteoarthritis, left knee: Secondary | ICD-10-CM | POA: Diagnosis not present

## 2022-07-08 DIAGNOSIS — K080 Exfoliation of teeth due to systemic causes: Secondary | ICD-10-CM | POA: Diagnosis not present

## 2022-07-13 ENCOUNTER — Ambulatory Visit (INDEPENDENT_AMBULATORY_CARE_PROVIDER_SITE_OTHER): Payer: Medicare Other | Admitting: Orthopaedic Surgery

## 2022-07-13 ENCOUNTER — Encounter: Payer: Self-pay | Admitting: Orthopaedic Surgery

## 2022-07-13 DIAGNOSIS — G8929 Other chronic pain: Secondary | ICD-10-CM | POA: Diagnosis not present

## 2022-07-13 DIAGNOSIS — M1712 Unilateral primary osteoarthritis, left knee: Secondary | ICD-10-CM

## 2022-07-13 DIAGNOSIS — M25562 Pain in left knee: Secondary | ICD-10-CM | POA: Diagnosis not present

## 2022-07-13 NOTE — Progress Notes (Signed)
PROCEDURE NOTE:  The patient requests injections of the left knee , verbal consent was obtained.  The left knee was prepped appropriately after time out was performed.   Sterile technique was observed and injection of 1 cc of DepoMedrol 40 mg with several cc's of plain xylocaine. Anesthesia was provided by ethyl chloride and a 20-gauge needle was used to inject the knee area. The injection was tolerated well.  A band aid dressing was applied.  The patient was advised to apply ice later today and tomorrow to the injection sight as needed.  Encounter Diagnoses  Name Primary?   Chronic pain of left knee Yes   Primary osteoarthritis of left knee    Return prn  Call if any problem.  Precautions discussed.  Electronically Signed Sanjuana Kava, MD 3/20/202410:35 AM

## 2022-07-21 ENCOUNTER — Ambulatory Visit: Payer: No Typology Code available for payment source | Attending: Student | Admitting: Student

## 2022-07-21 ENCOUNTER — Encounter: Payer: Self-pay | Admitting: Student

## 2022-07-21 VITALS — BP 132/60 | HR 67 | Ht 67.5 in | Wt 203.4 lb

## 2022-07-21 DIAGNOSIS — N1831 Chronic kidney disease, stage 3a: Secondary | ICD-10-CM | POA: Diagnosis not present

## 2022-07-21 DIAGNOSIS — I2510 Atherosclerotic heart disease of native coronary artery without angina pectoris: Secondary | ICD-10-CM

## 2022-07-21 DIAGNOSIS — I10 Essential (primary) hypertension: Secondary | ICD-10-CM | POA: Diagnosis not present

## 2022-07-21 DIAGNOSIS — E785 Hyperlipidemia, unspecified: Secondary | ICD-10-CM

## 2022-07-21 NOTE — Addendum Note (Signed)
Addended by: Levonne Hubert on: 07/21/2022 03:47 PM   Modules accepted: Orders

## 2022-07-21 NOTE — Progress Notes (Signed)
Cardiology Office Note    Date:  07/21/2022  ID:  Alexander Clayton, DOB 1927/05/16, MRN [id number] Cardiologist: Dorris Carnes, MD    History of Present Illness:    Alexander Clayton is a 89 y.o. male with past medical history of CAD (s/p Coronary CT in 09/2018 with diffuse CAD and FFR only positive in very distal branches and in the mid portion of a non-dominant LCx artery with medical management recommended), HTN, HLD, Type 2 DM, Stage 3 CKD and bladder cancer (s/p immunotherapy) who presents to the office today for overdue annual follow-up.   He was examined by myself in 06/2020 and denied any recent anginal symptoms at that time. 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 He was continued on his current cardiac medications including ASA 81 mg daily, Atorvastatin 20 mg daily, Amlodipine 10 mg daily, Losartan 100 mg daily and Lozol 2.5 mg daily.  In talking with the patient today, he reports he has overall been doing well since his last visit.  He does have intermittent knee pain and receives injections with improvement in his symptoms.  He denies any recent chest pain or dyspnea on exertion when performing routine activities. No recent palpitations, orthopnea or PND. He does experience intermittent lower extremity edema but says this has overall been stable. He does not add salt to his food but does pick up food from local restaurants several times a week. Says he does feel weak after taking his insulin and has follow-up with his PCP next week to see if his dose can be adjusted based on follow-up labs.   Studies Reviewed:   EKG: EKG is ordered today and demonstrates NSR, HR 67 with no acute ST changes.   Coronary CT: 09/2018 IMPRESSION: 1. Coronary calcium score of 3780. This was 51 percentile for age and sex matched control.   2. Normal coronary origin with right dominance.   3. Moderate to severe diffuse calcified plaque with  moderate stenosis in the mid and distal RCA, proximal LAD, proximal portion of ramus intermedius and proximal and mid portion of non-dominant LCX artery. Additional analysis with CT FFR will be submitted.  1. Left Main: 0.98. 2. LAD: Proximal: 0.96.  Mid: 0.85.  Distal 0.77. 3. Ramus intermedius: Proximal: 0.93.  Mid: 0.80.  Distal: 0.76. 4. LCX: Proximal: 0.97.  Mid: 0.84.  Distal: 0.68. 5. RCA: Proximal: 0.99.  Mid: 0.90.  Distal: 0.80.   IMPRESSION: 1. CT FFR analysis showed stenoses in very portions of LAD, ramus intermedius and in the mid portion of a non-dominant LCX artery. Aggressive medical management is recommended.   Echocardiogram: 11/2019 IMPRESSIONS     1. Left ventricular ejection fraction, by estimation, is 55 to 60%. The  left ventricle has normal function. The left ventricle has no regional  wall motion abnormalities. There is mild left ventricular hypertrophy.  Left ventricular diastolic parameters  are consistent with Grade I diastolic dysfunction (impaired relaxation).   2. Right ventricular systolic function is normal. The right ventricular  size is normal.   3. The mitral valve is normal in structure. No evidence of mitral valve  regurgitation. No evidence of mitral stenosis.   4. The aortic valve was not well visualized. Aortic valve regurgitation  is not visualized. No aortic stenosis is present.   5. The inferior vena cava is normal in size with greater than 50%  respiratory variability, suggesting right atrial pressure of 3 mmHg.  Physical Exam:   VS:  BP 132/60   Pulse 67   Ht 5' 7.5" (1.715 m)   Wt 203 lb 6.4 oz (92.3 kg)   SpO2 95%   BMI 31.39 kg/m    Wt Readings from Last 3 Encounters:  07/21/22 203 lb 6.4 oz (92.3 kg)  04/13/22 200 lb (90.7 kg)  01/27/22 201 lb (91.2 kg)    GEN: Pleasant, elderly male appearing in no acute distress NECK: No JVD; No carotid bruits CARDIAC: RRR, no murmurs, rubs, gallops RESPIRATORY:  Clear to  auscultation without rales, wheezing or rhonchi  ABDOMEN: Appears non-distended. No obvious abdominal masses. EXTREMITIES: No clubbing or cyanosis. Trace lower extremity edema.  Distal pedal pulses are 2+ bilaterally.   Assessment and Plan:   1. CAD - Prior Coronary CT in 2020 showed diffuse CAD with FFR only being positive in the distal branches and medical management was recommended as outlined above. - He denies any recent anginal symptoms and given his advanced age, will continue with medical management at this time. Continue current medical therapy with ASA 81 mg daily and Atorvastatin 20 mg daily.  2. HTN - His blood pressure is well-controlled at 132/60 during today's visit. Continue current medical therapy with Amlodipine 10 mg daily (this could be contributing to his intermittent lower extremity edema but he has been on this dose for several years without acute changes in his symptoms), Lozol 2.5 mg daily and Losartan 100 mg daily.  3. HLD - FLP in 12/2021 showed total cholesterol of 108, triglycerides 203, HDL 33 and LDL 34. Continue medical therapy with Atorvastatin 20mg  daily.   4. Stage 3 CKD - Creatinine was stable at 1.06 in 12/2021 with K+ at 3.9. He does remain on Lozol 2.5 mg daily.  Signed, Erma Heritage, PA-C

## 2022-07-21 NOTE — Patient Instructions (Signed)
Medication Instructions:  Your physician recommends that you continue on your current medications as directed. Please refer to the Current Medication list given to you today.  *If you need a refill on your cardiac medications before your next appointment, please call your pharmacy*   Lab Work: NONE   If you have labs (blood work) drawn today and your tests are completely normal, you will receive your results only by: [place] (if you have MyChart) OR A paper copy in the mail If you have any lab test that is abnormal or we need to change your treatment, we will call you to review the results.   Testing/Procedures: NONE    Follow-Up: At [facility], you and your health needs are our priority.  As part of our continuing mission to provide you with exceptional heart care, we have created designated Provider Care Teams.  These Care Teams include your primary Cardiologist (physician) and Advanced Practice Providers (APPs -  Physician Assistants and Nurse Practitioners) who all work together to provide you with the care you need, when you need it.  We recommend signing up for the patient portal called "MyChart".  Sign up information is provided on this After Visit Summary.  MyChart is used to connect with patients for Virtual Visits (Telemedicine).  Patients are able to view lab/test results, encounter notes, upcoming appointments, etc.  Non-urgent messages can be sent to your provider as well.   To learn more about what you can do with MyChart, go to [email or web address].    Your next appointment:   1 year(s)  Provider:   Dorris Carnes, MD or Bernerd Pho, PA-C    Other Instructions Thank you for choosing [facility]!

## 2022-07-22 DIAGNOSIS — Z79899 Other long term (current) drug therapy: Secondary | ICD-10-CM | POA: Diagnosis not present

## 2022-07-22 DIAGNOSIS — E785 Hyperlipidemia, unspecified: Secondary | ICD-10-CM | POA: Diagnosis not present

## 2022-07-22 DIAGNOSIS — I10 Essential (primary) hypertension: Secondary | ICD-10-CM | POA: Diagnosis not present

## 2022-08-02 DIAGNOSIS — R7309 Other abnormal glucose: Secondary | ICD-10-CM | POA: Diagnosis not present

## 2022-08-02 DIAGNOSIS — E1140 Type 2 diabetes mellitus with diabetic neuropathy, unspecified: Secondary | ICD-10-CM | POA: Diagnosis not present

## 2022-08-02 DIAGNOSIS — E785 Hyperlipidemia, unspecified: Secondary | ICD-10-CM | POA: Diagnosis not present

## 2022-08-02 DIAGNOSIS — E1122 Type 2 diabetes mellitus with diabetic chronic kidney disease: Secondary | ICD-10-CM | POA: Diagnosis not present

## 2022-08-02 DIAGNOSIS — R809 Proteinuria, unspecified: Secondary | ICD-10-CM | POA: Diagnosis not present

## 2022-08-05 DIAGNOSIS — K080 Exfoliation of teeth due to systemic causes: Secondary | ICD-10-CM | POA: Diagnosis not present

## 2022-08-10 DIAGNOSIS — K080 Exfoliation of teeth due to systemic causes: Secondary | ICD-10-CM | POA: Diagnosis not present

## 2022-10-04 ENCOUNTER — Encounter: Payer: Self-pay | Admitting: Orthopaedic Surgery

## 2022-10-04 ENCOUNTER — Ambulatory Visit (INDEPENDENT_AMBULATORY_CARE_PROVIDER_SITE_OTHER): Payer: Medicare Other | Admitting: Orthopaedic Surgery

## 2022-10-04 DIAGNOSIS — G8929 Other chronic pain: Secondary | ICD-10-CM

## 2022-10-04 DIAGNOSIS — M25562 Pain in left knee: Secondary | ICD-10-CM | POA: Diagnosis not present

## 2022-10-04 MED ORDER — METHYLPREDNISOLONE ACETATE 40 MG/ML IJ SUSP
40.0000 mg | Freq: Once | INTRAMUSCULAR | Status: AC
  Administered 2022-10-04: 40 mg via INTRA_ARTICULAR

## 2022-10-04 MED ORDER — METHYLPREDNISOLONE ACETATE 40 MG/ML IJ SUSP: 40.0000 mg | Freq: Once | INTRAMUSCULAR | Status: DC

## 2022-10-04 NOTE — Progress Notes (Signed)
PROCEDURE NOTE:  The patient requests injections of the left knee , verbal consent was obtained.  The left knee was prepped appropriately after time out was performed.   Sterile technique was observed and injection of 1 cc of DepoMedrol 40 mg with several cc's of plain xylocaine. Anesthesia was provided by ethyl chloride and a 20-gauge needle was used to inject the knee area. The injection was tolerated well.  A band aid dressing was applied.  The patient was advised to apply ice later today and tomorrow to the injection sight as needed.  Encounter Diagnosis  Name Primary?   Chronic pain of left knee Yes   Return prn  Call if any problem.  Precautions discussed.  Electronically Signed Darreld Mclean, MD 6/11/20249:15 AM

## 2022-10-04 NOTE — Addendum Note (Signed)
Addended by: Michaele Offer on: 10/04/2022 01:06 PM   Modules accepted: Orders

## 2022-10-17 DIAGNOSIS — H524 Presbyopia: Secondary | ICD-10-CM | POA: Diagnosis not present

## 2022-10-26 DIAGNOSIS — E1129 Type 2 diabetes mellitus with other diabetic kidney complication: Secondary | ICD-10-CM | POA: Diagnosis not present

## 2022-11-04 ENCOUNTER — Encounter: Payer: Self-pay | Admitting: Emergency Medicine

## 2022-11-04 ENCOUNTER — Ambulatory Visit
Admission: EM | Admit: 2022-11-04 | Discharge: 2022-11-04 | Disposition: A | Discharge status: Home / Self Care | Payer: Medicare Other | Attending: Family Medicine | Admitting: Family Medicine

## 2022-11-04 DIAGNOSIS — R3915 Urgency of urination: Secondary | ICD-10-CM

## 2022-11-04 DIAGNOSIS — R531 Weakness: Secondary | ICD-10-CM

## 2022-11-04 DIAGNOSIS — R197 Diarrhea, unspecified: Secondary | ICD-10-CM

## 2022-11-04 LAB — POCT URINALYSIS DIP (MANUAL ENTRY)
Bilirubin, UA: NEGATIVE
Blood, UA: NEGATIVE
Glucose, UA: NEGATIVE mg/dL
Ketones, POC UA: NEGATIVE mg/dL
Leukocytes, UA: NEGATIVE
Nitrite, UA: NEGATIVE
Protein Ur, POC: 100 mg/dL — AB
Spec Grav, UA: 1.02 (ref 1.010–1.025)
Urobilinogen, UA: 1 E.U./dL
pH, UA: 7 (ref 5.0–8.0)

## 2022-11-04 NOTE — Discharge Instructions (Signed)
Your urine test today did not show evidence of urinary tract infection.  I have sent it out for culture just to make sure.  We will also get some labs today to further evaluate your symptoms.  I am hoping something viral is causing your symptoms that should resolve on its own.  You may take Imodium if the diarrhea returns, stay well-hydrated including electrolyte solutions and water.  If your symptoms worsen at any time go to the emergency department.  Will be in touch if any of your labs are abnormal.

## 2022-11-04 NOTE — ED Triage Notes (Signed)
Diarrhea and weakness on Wednesday.  States difficulty urinating.  States now urinary frequency with little output.

## 2022-11-04 NOTE — ED Provider Notes (Signed)
[facility]    CSN: [id number] Arrival date & time: 11/04/22  1359      History   Chief Complaint No chief complaint on file.   HPI Alexander Clayton is a 89 y.o. male.   Patient presenting today with diarrhea and weakness x 2 days.  States initially had a fever but that has resolved since the first 12 hours or so.  States started yesterday with urinary frequency and urgency with decreased output.  Had some burning yesterday but none today with urination.  Denies upper respiratory symptoms, chest pain, shortness of breath, abdominal pain, nausea vomiting.  So far not trying anything for symptoms.  Concerned about a UTI.  Does have a history of chronic kidney disease.    Past Medical History:  Diagnosis Date   Bladder cancer (HCC)    CAD (coronary artery disease)    a. per coronary CTA 09/2018 - managed medically.   CKD (chronic kidney disease), stage III (HCC)    Diabetes mellitus without complication (HCC)    Hepatic steatosis    by CT 08/2018   Hiatal hernia    small by CT 08/2018   Hypercholesteremia    Hypertension     Patient Active Problem List   Diagnosis Date Noted   Hypothyroidism 01/25/2022   Age-related macular degeneration 01/25/2022   Focal neurological deficit 12/16/2019   CKD (chronic kidney disease) stage 3, GFR 30-59 ml/min 09/04/2018   Essential hypertension 09/04/2018   Mixed hyperlipidemia 09/04/2018   Chest pain 09/03/2018   Osteoarthritis of left knee 06/07/2017   Benign prostate hyperplasia 05/09/2011    Past Surgical History:  Procedure Laterality Date   APPENDECTOMY     HERNIA REPAIR         Home Medications    Prior to Admission medications   Medication Sig Start Date End Date Taking? Authorizing Provider  acetaminophen (TYLENOL) 500 MG tablet Take 1,000 mg by mouth 3 (three) times daily.     [provider]  amLODipine (NORVASC) 10 MG tablet Take 10 mg by mouth every morning.     [provider]   aspirin EC 81 MG tablet Take 81 mg by mouth in the morning and at bedtime.    [provider]  atorvastatin (LIPITOR) 20 MG tablet Take 20 mg by mouth at bedtime.     [provider]  cetirizine (ZYRTEC) 10 MG tablet Take 10 mg by mouth at bedtime.    [provider]  Cholecalciferol (VITAMIN D-3) 125 MCG (5000 UT) TABS Take by mouth.    [provider]  gabapentin (NEURONTIN) 300 MG capsule Take 300 mg by mouth 2 (two) times a day.    [provider]  HUMALOG KWIKPEN 100 UNIT/ML KwikPen SMARTSIG:4 Unit(s) SUB-Q Twice Daily 05/09/22   [provider]  indapamide (LOZOL) 2.5 MG tablet Take 2.5 mg by mouth every morning.     [provider]  Insulin Aspart (NOVOLOG IJ) Inject into the skin. 08/10/22   [provider]  insulin glargine (LANTUS) 100 UNIT/ML injection Inject 0.4 mLs (40 Units total) into the skin daily. 07/16/20   Strader, Lennart Pall, PA-C  levothyroxine (SYNTHROID) 75 MCG tablet Take 75 mcg by mouth daily before breakfast.    [provider]  losartan (COZAAR) 100 MG tablet Take 100 mg by mouth every morning.    [provider]  metFORMIN (GLUCOPHAGE) 500 MG tablet Take 500 mg by mouth 2 (two) times daily.  [provider]  Multiple Vitamin (MULTIVITAMIN) capsule Take 1 capsule by mouth at bedtime.     [provider]  Multiple Vitamins-Minerals (ICAPS AREDS 2 PO) Take 1 tablet by mouth 2 (two) times a day.     [provider]  Potassium Chloride ER 20 MEQ TBCR Take 1 tablet by mouth daily. 12/24/19   [provider]  Tart Cherry 1200 MG CAPS Take by mouth.    [provider]    Family History Family History  Problem Relation Age of Onset   Hypertension Father    Cirrhosis Brother    Diabetes Mellitus II Brother     Social History Social History   Tobacco Use   Smoking status: Never   Smokeless tobacco: Never  Vaping Use   Vaping status:  Never Used  Substance Use Topics   Alcohol use: No    Alcohol/week: 0.0 standard drinks of alcohol   Drug use: No     Allergies   Ciprofloxacin and Vioxx [rofecoxib]   Review of Systems Review of Systems Per HPI  Physical Exam Triage Vital Signs ED Triage Vitals  Encounter Vitals Group     BP 11/04/22 1410 127/69     Systolic BP Percentile --      Diastolic BP Percentile --      Pulse Rate 11/04/22 1410 95     Resp 11/04/22 1410 20     Temp 11/04/22 1410 98.5 F (36.9 C)     Temp Source 11/04/22 1410 Oral     SpO2 11/04/22 1410 93 %     Weight --      Height --      Head Circumference --      Peak Flow --      Pain Score 11/04/22 1411 0     Pain Loc --      Pain Education --      Exclude from Growth Chart --    No data found.  Updated Vital Signs BP 127/69 (BP Location: Right Arm)   Pulse 95   Temp 98.5 F (36.9 C) (Oral)   Resp 20   SpO2 93%   Visual Acuity Right Eye Distance:   Left Eye Distance:   Bilateral Distance:    Right Eye Near:   Left Eye Near:    Bilateral Near:     Physical Exam Vitals and nursing note reviewed.  Constitutional:      Appearance: Normal appearance.  HENT:     Head: Atraumatic.     Mouth/Throat:     Mouth: Mucous membranes are moist.     Pharynx: Oropharynx is clear.  Eyes:     Extraocular Movements: Extraocular movements intact.     Conjunctiva/sclera: Conjunctivae normal.  Cardiovascular:     Rate and Rhythm: Normal rate and regular rhythm.  Pulmonary:     Effort: Pulmonary effort is normal.     Breath sounds: Normal breath sounds.  Abdominal:     General: Bowel sounds are normal. There is no distension.     Palpations: Abdomen is soft.     Tenderness: There is no abdominal tenderness. There is no right CVA tenderness, left CVA tenderness or guarding.  Musculoskeletal:     Cervical back: Normal range of motion and neck supple.     Comments: Currently ambulating with a cane due to weakness, states he is  ambulatory unassisted at baseline  Skin:    General: Skin is warm and dry.  Neurological:  General: No focal deficit present.     Mental Status: He is oriented to person, place, and time.  Psychiatric:        Mood and Affect: Mood normal.        Thought Content: Thought content normal.        Judgment: Judgment normal.      UC Treatments / Results  Labs (all labs ordered are listed, but only abnormal results are displayed) Labs Reviewed  POCT URINALYSIS DIP (MANUAL ENTRY) - Abnormal; Notable for the following components:      Result Value   Protein Ur, POC =100 (*)    All other components within normal limits  URINE CULTURE  COMPREHENSIVE METABOLIC PANEL  CBC WITH DIFFERENTIAL/PLATELET    EKG   Radiology No results found.  Procedures Procedures (including critical care time)  Medications Ordered in UC Medications - No data to display  Initial Impression / Assessment and Plan / UC Course  I have reviewed the triage vital signs and the nursing notes.  Pertinent labs & imaging results that were available during my care of the patient were reviewed by me and considered in my medical decision making (see chart for details).     Patient is overall very well-appearing, alert and oriented and in good spirits during exam.  His vital signs are within normal limits today.  His urinalysis does not show evidence of urinary tract infection.  Potentially a viral GI illness, however will obtain a urine culture and CBC, CMP for further rule out.  Discussed Imodium if needed for any ongoing diarrhea, hydration with both water and electrolyte solutions and going to the emergency department for any worsening symptoms.  Final Clinical Impressions(s) / UC Diagnoses   Final diagnoses:  Urinary urgency  Diarrhea, unspecified type  Weakness     Discharge Instructions      Your urine test today did not show evidence of urinary tract infection.  I have sent it out for culture just  to make sure.  We will also get some labs today to further evaluate your symptoms.  I am hoping something viral is causing your symptoms that should resolve on its own.  You may take Imodium if the diarrhea returns, stay well-hydrated including electrolyte solutions and water.  If your symptoms worsen at any time go to the emergency department.  Will be in touch if any of your labs are abnormal.    ED Prescriptions   None    PDMP not reviewed this encounter.   Particia Nearing, [place] 11/04/22 1511

## 2022-11-05 ENCOUNTER — Encounter (HOSPITAL_COMMUNITY): Payer: Self-pay

## 2022-11-05 ENCOUNTER — Emergency Department (HOSPITAL_COMMUNITY): Payer: Medicare Other

## 2022-11-05 ENCOUNTER — Telehealth: Payer: Self-pay

## 2022-11-05 ENCOUNTER — Emergency Department (HOSPITAL_COMMUNITY)
Admission: EM | Admit: 2022-11-05 | Discharge: 2022-11-06 | Disposition: A | Discharge status: Home / Self Care | Payer: Medicare Other | Attending: Emergency Medicine | Admitting: Emergency Medicine

## 2022-11-05 ENCOUNTER — Other Ambulatory Visit: Payer: Self-pay

## 2022-11-05 DIAGNOSIS — Z8551 Personal history of malignant neoplasm of bladder: Secondary | ICD-10-CM | POA: Insufficient documentation

## 2022-11-05 DIAGNOSIS — E1122 Type 2 diabetes mellitus with diabetic chronic kidney disease: Secondary | ICD-10-CM | POA: Diagnosis not present

## 2022-11-05 DIAGNOSIS — N183 Chronic kidney disease, stage 3 unspecified: Secondary | ICD-10-CM | POA: Diagnosis not present

## 2022-11-05 DIAGNOSIS — R350 Frequency of micturition: Secondary | ICD-10-CM | POA: Insufficient documentation

## 2022-11-05 DIAGNOSIS — I129 Hypertensive chronic kidney disease with stage 1 through stage 4 chronic kidney disease, or unspecified chronic kidney disease: Secondary | ICD-10-CM | POA: Diagnosis not present

## 2022-11-05 DIAGNOSIS — K5790 Diverticulosis of intestine, part unspecified, without perforation or abscess without bleeding: Secondary | ICD-10-CM | POA: Diagnosis not present

## 2022-11-05 DIAGNOSIS — I2510 Atherosclerotic heart disease of native coronary artery without angina pectoris: Secondary | ICD-10-CM | POA: Diagnosis not present

## 2022-11-05 DIAGNOSIS — N3289 Other specified disorders of bladder: Secondary | ICD-10-CM | POA: Diagnosis not present

## 2022-11-05 DIAGNOSIS — R6883 Chills (without fever): Secondary | ICD-10-CM | POA: Diagnosis not present

## 2022-11-05 DIAGNOSIS — R109 Unspecified abdominal pain: Secondary | ICD-10-CM | POA: Diagnosis not present

## 2022-11-05 DIAGNOSIS — I700 Atherosclerosis of aorta: Secondary | ICD-10-CM | POA: Diagnosis not present

## 2022-11-05 LAB — URINALYSIS, ROUTINE W REFLEX MICROSCOPIC
Bacteria, UA: NONE SEEN
Bilirubin Urine: NEGATIVE
Glucose, UA: NEGATIVE mg/dL
Hgb urine dipstick: NEGATIVE
Ketones, ur: NEGATIVE mg/dL
Leukocytes,Ua: NEGATIVE
Nitrite: NEGATIVE
Protein, ur: 100 mg/dL — AB
Specific Gravity, Urine: 1.014 (ref 1.005–1.030)
pH: 6 (ref 5.0–8.0)

## 2022-11-05 LAB — CBC WITH DIFFERENTIAL/PLATELET
Abs Immature Granulocytes: 0.09 10*3/uL — ABNORMAL HIGH (ref 0.00–0.07)
Basophils Absolute: 0.1 10*3/uL (ref 0.0–0.2)
Basophils Absolute: 0.1 10*3/uL (ref 0.0–0.1)
Basophils Relative: 0 %
Basos: 0 %
EOS (ABSOLUTE): 0.1 10*3/uL (ref 0.0–0.4)
Eos: 0 %
Eosinophils Absolute: 0.1 10*3/uL (ref 0.0–0.5)
Eosinophils Relative: 0 %
HCT: 44 % (ref 39.0–52.0)
Hematocrit: 40.9 % (ref 37.5–51.0)
Hemoglobin: 14.2 g/dL (ref 13.0–17.7)
Hemoglobin: 14.8 g/dL (ref 13.0–17.0)
Immature Grans (Abs): 0.2 10*3/uL — ABNORMAL HIGH (ref 0.0–0.1)
Immature Granulocytes: 1 %
Immature Granulocytes: 1 %
Lymphocytes Absolute: 1.8 10*3/uL (ref 0.7–3.1)
Lymphocytes Relative: 12 %
Lymphs Abs: 2.3 10*3/uL (ref 0.7–4.0)
Lymphs: 8 %
MCH: 31.6 pg (ref 26.6–33.0)
MCH: 31.4 pg (ref 26.0–34.0)
MCHC: 34.7 g/dL (ref 31.5–35.7)
MCHC: 33.6 g/dL (ref 30.0–36.0)
MCV: 91 fL (ref 79–97)
MCV: 93.2 fL (ref 80.0–100.0)
Monocytes Absolute: 2 10*3/uL — ABNORMAL HIGH (ref 0.1–0.9)
Monocytes Absolute: 2 10*3/uL — ABNORMAL HIGH (ref 0.1–1.0)
Monocytes Relative: 10 %
Monocytes: 9 %
Neutro Abs: 14.3 10*3/uL — ABNORMAL HIGH (ref 1.7–7.7)
Neutrophils Absolute: 17.3 10*3/uL — ABNORMAL HIGH (ref 1.4–7.0)
Neutrophils Relative %: 77 %
Neutrophils: 82 %
Platelets: 305 10*3/uL (ref 150–450)
Platelets: 351 10*3/uL (ref 150–400)
RBC: 4.49 x10E6/uL (ref 4.14–5.80)
RBC: 4.72 MIL/uL (ref 4.22–5.81)
RDW: 13.3 % (ref 11.6–15.4)
RDW: 13.2 % (ref 11.5–15.5)
WBC: 21.4 10*3/uL (ref 3.4–10.8)
WBC: 18.9 10*3/uL — ABNORMAL HIGH (ref 4.0–10.5)
nRBC: 0 % (ref 0.0–0.2)

## 2022-11-05 LAB — COMPREHENSIVE METABOLIC PANEL
ALT: 15 IU/L (ref 0–44)
ALT: 19 U/L (ref 0–44)
AST: 15 IU/L (ref 0–40)
AST: 20 U/L (ref 15–41)
Albumin: 4.1 g/dL (ref 3.6–4.6)
Albumin: 4 g/dL (ref 3.5–5.0)
Alkaline Phosphatase: 114 IU/L (ref 44–121)
Alkaline Phosphatase: 103 U/L (ref 38–126)
Anion gap: 13 (ref 5–15)
BUN/Creatinine Ratio: 16 (ref 10–24)
BUN: 18 mg/dL (ref 10–36)
BUN: 23 mg/dL (ref 8–23)
Bilirubin Total: 0.6 mg/dL (ref 0.0–1.2)
CO2: 24 mmol/L (ref 20–29)
CO2: 26 mmol/L (ref 22–32)
Calcium: 9.4 mg/dL (ref 8.6–10.2)
Calcium: 9.2 mg/dL (ref 8.9–10.3)
Chloride: 99 mmol/L (ref 96–106)
Chloride: 96 mmol/L — ABNORMAL LOW (ref 98–111)
Creatinine, Ser: 1.1 mg/dL (ref 0.76–1.27)
Creatinine, Ser: 1.17 mg/dL (ref 0.61–1.24)
GFR, Estimated: 58 mL/min — ABNORMAL LOW (ref >60)
Globulin, Total: 3.1 g/dL (ref 1.5–4.5)
Glucose, Bld: 131 mg/dL — ABNORMAL HIGH (ref 70–99)
Glucose: 103 mg/dL — ABNORMAL HIGH (ref 70–99)
Potassium: 3.8 mmol/L (ref 3.5–5.2)
Potassium: 3.1 mmol/L — ABNORMAL LOW (ref 3.5–5.1)
Sodium: 137 mmol/L (ref 134–144)
Sodium: 135 mmol/L (ref 135–145)
Total Bilirubin: 0.8 mg/dL (ref 0.3–1.2)
Total Protein: 7.2 g/dL (ref 6.0–8.5)
Total Protein: 8.4 g/dL — ABNORMAL HIGH (ref 6.5–8.1)
eGFR: 63 mL/min/{1.73_m2} (ref >59)

## 2022-11-05 LAB — LACTIC ACID, PLASMA
Lactic Acid, Venous: 1.2 mmol/L (ref 0.5–1.9)
Lactic Acid, Venous: 1 mmol/L (ref 0.5–1.9)

## 2022-11-05 MED ORDER — IOHEXOL 300 MG/ML  SOLN
100.0000 mL | Freq: Once | INTRAMUSCULAR | Status: AC | PRN
  Administered 2022-11-06: 100 mL via INTRAVENOUS

## 2022-11-05 NOTE — ED Provider Notes (Signed)
[facility] Emergency Department Provider Note MRN:  [id number]  Arrival date & time: 11/06/22     Chief Complaint   Urinary Frequency   History of Present Illness   Alexander Clayton is a 89 y.o. year-old male with a history of CAD presenting to the ED with chief complaint of urinary frequency.  Having to urinate every few minutes, pain with urination, sent here from urgent care for possible sepsis.  Having chills, fevers, night sweats.  Denies abdominal pain, no chest pain, no cough  Review of Systems  A thorough review of systems was obtained and all systems are negative except as noted in the HPI and PMH.   Patient's Health History    Past Medical History:  Diagnosis Date   Bladder cancer (HCC)    CAD (coronary artery disease)    a. per coronary CTA 09/2018 - managed medically.   CKD (chronic kidney disease), stage III (HCC)    Diabetes mellitus without complication (HCC)    Hepatic steatosis    by CT 08/2018   Hiatal hernia    small by CT 08/2018   Hypercholesteremia    Hypertension     Past Surgical History:  Procedure Laterality Date   APPENDECTOMY     HERNIA REPAIR      Family History  Problem Relation Age of Onset   Hypertension Father    Cirrhosis Brother    Diabetes Mellitus II Brother     Social History   Socioeconomic History   Marital status: Married    Spouse name: Not on file   Number of children: Not on file   Years of education: Not on file   Highest education level: Not on file  Occupational History   Not on file  Tobacco Use   Smoking status: Never   Smokeless tobacco: Never  Vaping Use   Vaping status: Never Used  Substance and Sexual Activity   Alcohol use: No    Alcohol/week: 0.0 standard drinks of alcohol   Drug use: No   Sexual activity: Not on file  Other Topics Concern   Not on file  Social History Narrative   Not on file   Social Determinants of Health   Financial Resource Strain: Not on file   Food Insecurity: Not on file  Transportation Needs: Not on file  Physical Activity: Not on file  Stress: Not on file  Social Connections: Not on file  Intimate Partner Violence: Not on file     Physical Exam   Vitals:   11/06/22 0145 11/06/22 0215  BP: 133/75 127/82  Pulse: 97 99  Resp: 18 17  Temp: 98.1 F (36.7 C) 98.1 F (36.7 C)  SpO2: 95% 93%    CONSTITUTIONAL: Well-appearing, NAD NEURO/PSYCH:  Alert and oriented x 3, no focal deficits EYES:  eyes equal and reactive ENT/NECK:  no LAD, no JVD CARDIO: Regular rate, well-perfused, normal S1 and S2 PULM:  CTAB no wheezing or rhonchi GI/GU:  non-distended, non-tender MSK/SPINE:  No gross deformities, no edema SKIN:  no rash, atraumatic   *Additional and/or pertinent findings included in MDM below  Diagnostic and Interventional Summary    EKG Interpretation Date/Time:    Ventricular Rate:    PR Interval:    QRS Duration:    QT Interval:    QTC Calculation:   R Axis:      Text Interpretation:         Labs Reviewed  URINALYSIS, ROUTINE W REFLEX MICROSCOPIC -  Abnormal; Notable for the following components:      Result Value   Protein, ur 100 (*)    All other components within normal limits  COMPREHENSIVE METABOLIC PANEL - Abnormal; Notable for the following components:   Potassium 3.1 (*)    Chloride 96 (*)    Glucose, Bld 131 (*)    Total Protein 8.4 (*)    GFR, Estimated 58 (*)    All other components within normal limits  CBC WITH DIFFERENTIAL/PLATELET - Abnormal; Notable for the following components:   WBC 18.9 (*)    Neutro Abs 14.3 (*)    Monocytes Absolute 2.0 (*)    Abs Immature Granulocytes 0.09 (*)    All other components within normal limits  CULTURE, BLOOD (ROUTINE X 2)  CULTURE, BLOOD (ROUTINE X 2)  URINE CULTURE  LACTIC ACID, PLASMA  LACTIC ACID, PLASMA    CT ABDOMEN PELVIS W CONTRAST  Final Result    DG Chest Port 1 View  Final Result      Medications  iohexol (OMNIPAQUE)  300 MG/ML solution 100 mL (100 mLs Intravenous Contrast Given 11/06/22 0026)  cephALEXin (KEFLEX) capsule 500 mg (500 mg Oral Given 11/06/22 0230)     Procedures  /  Critical Care Procedures  ED Course and Medical Decision Making  Initial Impression and Ddx History of bladder cancer having dysuria, urinalysis without obvious infection, will obtain CT imaging for further evaluation of the bladder or any other abdominal source of chills, leukocytosis.  Past medical/surgical history that increases complexity of ED encounter: History of bladder cancer  Interpretation of Diagnostics I personally reviewed the laboratory assessment and my interpretation is as follows: Leukocytosis, otherwise no significant blood count or electrolyte disturbance  CT abdomen is unremarkable  Patient Reassessment and Ultimate Disposition/Management     A bit perplexing that patient's urinalysis is largely unremarkable, protein a bit high.  Clinically very suspicious for UTI, has been urinating every few minutes here in the emergency department, and burns, has leukocytosis. 1.  Will treat, send for culture.  Appropriate for discharge.  Patient management required discussion with the following services or consulting groups:  None  Complexity of Problems Addressed Acute illness or injury that poses threat of life of bodily function  Additional Data Reviewed and Analyzed Further history obtained from: Further history from spouse/family member  Additional Factors Impacting ED Encounter Risk Prescriptions  Elmer Sow. Pilar Plate, MD [facility] Emergency Medicine [facility] [email or web address]  Final Clinical Impressions(s) / ED Diagnoses     ICD-10-CM   1. Urinary frequency  R35.0       ED Discharge Orders          Ordered    cephALEXin (KEFLEX) 500 MG capsule  3 times daily        11/06/22 0229             Discharge Instructions Discussed with and Provided to Patient:      Discharge Instructions      You were evaluated in the Emergency Department and after careful evaluation, we did not find any emergent condition requiring admission or further testing in the hospital.  Your exam/testing today was overall reassuring.  Symptoms likely due to a UTI.  Take the Keflex antibiotic as directed.  Follow-up with your regular doctor.  Please return to the Emergency Department if you experience any worsening of your condition.  Thank you for allowing [place] to be a part of your care.  Sabas Sous, MD 11/06/22 [phone]

## 2022-11-05 NOTE — ED Provider Triage Note (Signed)
Emergency Medicine Provider Triage Evaluation Note  Alexander Clayton , a 89 y.o. male  was evaluated in triage.  Pt complains of dysuria.  Started yesterday.  Also with increased frequency.  Endorses subjective fever at home with chills.  Was seen by urgent care today there was concern for possible sepsis.  Denies abdominal pain.  States "the only thing that hurts is my penis when urinating".  Has been taking Tylenol.  Review of Systems  Positive: See above Negative: See above  Physical Exam  BP (!) 159/71 (BP Location: Right Arm)   Pulse 89   Temp 98 F (36.7 C)   Resp 16   Ht 5\' 7"  (1.702 m)   Wt 91.6 kg   SpO2 95%   BMI 31.64 kg/m  Gen:   Awake, no distress   Resp:  Normal effort  MSK:   Moves extremities without difficulty  Other:    Medical Decision Making  Medically screening exam initiated at 4:57 PM.  Appropriate orders placed.  Morton Stall was informed that the remainder of the evaluation will be completed by another provider, this initial triage assessment does not replace that evaluation, and the importance of remaining in the ED until their evaluation is complete.  Work up started   Gareth Eagle, PA-C 11/05/22 1700

## 2022-11-05 NOTE — Progress Notes (Signed)
ATC pt x 1 attempt, Left VM. To adv pt per Sharin Mons, PA pt should go to ED given age and concern for sepsis.

## 2022-11-05 NOTE — ED Notes (Signed)
Pt states "if you are going to stick me then you better put an IV in." This RN will hold off on entering blood work orders until patient is roomed and can have an IV.

## 2022-11-05 NOTE — ED Triage Notes (Signed)
Pt daughter in law reports  Sent by UC UA was negative at [facility] but told to come to hospital for possible sepsis Urinary frequency About every 30 minutes Since Wednesday Urinary pain Chills/fever/night sweats Since Wednesday Has not checked temperature but reports feeling hot Takes tylenol for arthritis 3 times a day

## 2022-11-05 NOTE — Telephone Encounter (Signed)
Pts granddaughter came into UC to get a answer about her grandfathers Urine culture results. After I reviewing the patients chart I noticed a nurse tried to reach out to the family. Pt's white blood count is elevated and proivder/nurse is concerned about sepsis. Pts family was notified and they agreed to take him to the ED.

## 2022-11-06 DIAGNOSIS — R109 Unspecified abdominal pain: Secondary | ICD-10-CM | POA: Diagnosis not present

## 2022-11-06 DIAGNOSIS — N3289 Other specified disorders of bladder: Secondary | ICD-10-CM | POA: Diagnosis not present

## 2022-11-06 DIAGNOSIS — K5790 Diverticulosis of intestine, part unspecified, without perforation or abscess without bleeding: Secondary | ICD-10-CM | POA: Diagnosis not present

## 2022-11-06 LAB — URINE CULTURE: Culture: NO GROWTH

## 2022-11-06 MED ORDER — CEPHALEXIN 500 MG PO CAPS
500.0000 mg | ORAL_CAPSULE | Freq: Once | ORAL | Status: AC
  Administered 2022-11-06: 500 mg via ORAL
  Filled 2022-11-06: qty 1

## 2022-11-06 MED ORDER — CEPHALEXIN 500 MG PO CAPS: 500.0000 mg | ORAL_CAPSULE | Freq: Three times a day (TID) | ORAL | 0 refills | Status: AC

## 2022-11-06 NOTE — ED Notes (Signed)
Patient transported to CT 

## 2022-11-06 NOTE — Discharge Instructions (Signed)
You were evaluated in the Emergency Department and after careful evaluation, we did not find any emergent condition requiring admission or further testing in the hospital.  Your exam/testing today was overall reassuring.  Symptoms likely due to a UTI.  Take the Keflex antibiotic as directed.  Follow-up with your regular doctor.  Please return to the Emergency Department if you experience any worsening of your condition.  Thank you for allowing [place] to be a part of your care.

## 2022-11-07 LAB — URINE CULTURE: Culture: NO GROWTH

## 2022-11-09 DIAGNOSIS — R197 Diarrhea, unspecified: Secondary | ICD-10-CM | POA: Diagnosis not present

## 2022-11-09 DIAGNOSIS — I700 Atherosclerosis of aorta: Secondary | ICD-10-CM | POA: Diagnosis not present

## 2022-11-09 DIAGNOSIS — D72829 Elevated white blood cell count, unspecified: Secondary | ICD-10-CM | POA: Diagnosis not present

## 2022-11-09 DIAGNOSIS — E1122 Type 2 diabetes mellitus with diabetic chronic kidney disease: Secondary | ICD-10-CM | POA: Diagnosis not present

## 2022-11-09 DIAGNOSIS — E1140 Type 2 diabetes mellitus with diabetic neuropathy, unspecified: Secondary | ICD-10-CM | POA: Diagnosis not present

## 2022-11-09 DIAGNOSIS — R7309 Other abnormal glucose: Secondary | ICD-10-CM | POA: Diagnosis not present

## 2022-11-10 LAB — CULTURE, BLOOD (ROUTINE X 2)
Culture: NO GROWTH
Culture: NO GROWTH
Special Requests: ADEQUATE
Special Requests: ADEQUATE

## 2022-11-11 ENCOUNTER — Telehealth: Payer: Self-pay

## 2022-11-11 NOTE — Telephone Encounter (Signed)
Transition Care Management Unsuccessful Follow-up Telephone Call  Date of discharge and from where:  [facility]  Attempts:  1st Attempt  Reason for unsuccessful TCM follow-up call:  No answer/busy   Lenard Forth [facility] Guide, [facility] [phone] [address] [place], [place], [place] [address] Phone: [phone] Email: [email or web address]

## 2022-11-14 ENCOUNTER — Telehealth: Payer: Self-pay

## 2022-11-14 NOTE — Telephone Encounter (Signed)
Transition Care Management Unsuccessful Follow-up Telephone Call  Date of discharge and from where:  [facility]  Attempts:  2nd Attempt  Reason for unsuccessful TCM follow-up call:  No answer/busy   Lenard Forth [facility] Guide, [facility] [phone] [address], [place], [place] [address] Phone: [phone] Email: [email or web address]

## 2022-12-20 ENCOUNTER — Telehealth: Payer: Self-pay | Admitting: Orthopaedic Surgery

## 2022-12-20 NOTE — Telephone Encounter (Signed)
Patient called lvm to call back he is having trouble in both his knees.  I return the patient call and the line is busy,

## 2023-01-04 DIAGNOSIS — Z23 Encounter for immunization: Secondary | ICD-10-CM | POA: Diagnosis not present

## 2023-01-16 DIAGNOSIS — H10503 Unspecified blepharoconjunctivitis, bilateral: Secondary | ICD-10-CM | POA: Diagnosis not present

## 2023-01-23 DIAGNOSIS — H10503 Unspecified blepharoconjunctivitis, bilateral: Secondary | ICD-10-CM | POA: Diagnosis not present

## 2023-01-23 DIAGNOSIS — H04123 Dry eye syndrome of bilateral lacrimal glands: Secondary | ICD-10-CM | POA: Diagnosis not present

## 2023-01-26 DIAGNOSIS — C672 Malignant neoplasm of lateral wall of bladder: Secondary | ICD-10-CM | POA: Diagnosis not present

## 2023-01-26 DIAGNOSIS — R972 Elevated prostate specific antigen [PSA]: Secondary | ICD-10-CM | POA: Diagnosis not present

## 2023-03-13 ENCOUNTER — Telehealth: Payer: Self-pay | Admitting: Orthopaedic Surgery

## 2023-03-13 ENCOUNTER — Ambulatory Visit
Admission: EM | Admit: 2023-03-13 | Discharge: 2023-03-13 | Disposition: A | Discharge status: Home / Self Care | Payer: Medicare Other | Attending: Nurse Practitioner | Admitting: Nurse Practitioner

## 2023-03-13 ENCOUNTER — Ambulatory Visit: Payer: No Typology Code available for payment source

## 2023-03-13 DIAGNOSIS — M25532 Pain in left wrist: Secondary | ICD-10-CM | POA: Diagnosis not present

## 2023-03-13 DIAGNOSIS — W19XXXA Unspecified fall, initial encounter: Secondary | ICD-10-CM | POA: Diagnosis not present

## 2023-03-13 DIAGNOSIS — S52572A Other intraarticular fracture of lower end of left radius, initial encounter for closed fracture: Secondary | ICD-10-CM | POA: Diagnosis not present

## 2023-03-13 NOTE — Telephone Encounter (Signed)
Returned the pt's call, he thinks he has broken his wrist.  He has not been to the ED or anywhere for treatment.  Advised we do not have any openings today and gave him the information for [facility].

## 2023-03-13 NOTE — Discharge Instructions (Addendum)
X-ray of the left wrist is pending.  You will be contacted when the x-ray result is received.  If the x-ray shows a fracture, you will need to follow-up in this clinic so we can provide a splint for the left wrist. Wear the brace to provide additional compression and support.  If you do not have any fractures in your wrist, you will continue to wear the brace for prolonged or strenuous activity. RICE therapy, rest, ice, compression, and elevation.  Apply ice for 20 minutes, remove for 1 hour, repeat as needed. Gentle range of motion exercises of the left wrist as tolerated. Recommend over-the-counter Tylenol 650 mg tablets for left wrist pain. If symptoms fail to improve with this treatment, it is recommended that you follow-up with Dr. Hilda Lias if symptoms have not improved over the next 2 to 3 weeks. Follow-up as needed.

## 2023-03-13 NOTE — ED Triage Notes (Signed)
Pt states he fell last night denies loc.  C/o left wrist pain. Left wrist wrapped in an ace wrap. Left hand and wrist swollen. +CMS

## 2023-03-13 NOTE — ED Provider Notes (Signed)
[facility]    CSN: [id number] Arrival date & time: 03/13/23  1638      History   Chief Complaint Chief Complaint  Patient presents with   Fall    HPI Alexander Clayton is a 89 y.o. male.   The history is provided by the patient.   Patient presents for complaints of left wrist pain after a fall 1 day ago.  Patient states that he believes he may have slipped on some water in his kitchen.  He is unsure if he fell with the left arm outstretched, states "it happened so fast."  Patient complains of pain to the generalized area of the left wrist.  States that he has more pain when he moves his wrist up and down.  He denies any prior injury or trauma to the left wrist.  States he has been taking ibuprofen for his pain.  Patient also had the wrist wrapped with an Ace bandage.  Patient reports he is right-hand dominant.  He denies loss of consciousness, bruising, radiation of pain, numbness or tingling of the left forearm.  Past Medical History:  Diagnosis Date   Bladder cancer (HCC)    CAD (coronary artery disease)    a. per coronary CTA 09/2018 - managed medically.   CKD (chronic kidney disease), stage III (HCC)    Diabetes mellitus without complication (HCC)    Hepatic steatosis    by CT 08/2018   Hiatal hernia    small by CT 08/2018   Hypercholesteremia    Hypertension     Patient Active Problem List   Diagnosis Date Noted   Hypothyroidism 01/25/2022   Age-related macular degeneration 01/25/2022   Focal neurological deficit 12/16/2019   CKD (chronic kidney disease) stage 3, GFR 30-59 ml/min 09/04/2018   Essential hypertension 09/04/2018   Mixed hyperlipidemia 09/04/2018   Chest pain 09/03/2018   Osteoarthritis of left knee 06/07/2017   Benign prostate hyperplasia 05/09/2011    Past Surgical History:  Procedure Laterality Date   APPENDECTOMY     HERNIA REPAIR         Home Medications    Prior to Admission medications   Medication Sig Start Date End  Date Taking? Authorizing Provider  acetaminophen (TYLENOL) 500 MG tablet Take 1,000 mg by mouth 3 (three) times daily.     [provider]  amLODipine (NORVASC) 10 MG tablet Take 10 mg by mouth every morning.     [provider]  aspirin EC 81 MG tablet Take 81 mg by mouth in the morning and at bedtime.    [provider]  atorvastatin (LIPITOR) 20 MG tablet Take 20 mg by mouth at bedtime.     [provider]  cetirizine (ZYRTEC) 10 MG tablet Take 10 mg by mouth at bedtime.    [provider]  Cholecalciferol (VITAMIN D-3) 125 MCG (5000 UT) TABS Take by mouth.    [provider]  gabapentin (NEURONTIN) 300 MG capsule Take 300 mg by mouth 2 (two) times a day.    [provider]  HUMALOG KWIKPEN 100 UNIT/ML KwikPen SMARTSIG:4 Unit(s) SUB-Q Twice Daily 05/09/22   [provider]  indapamide (LOZOL) 2.5 MG tablet Take 2.5 mg by mouth every morning.     [provider]  Insulin Aspart (NOVOLOG IJ) Inject into the skin. 08/10/22   [provider]  insulin glargine (LANTUS) 100 UNIT/ML injection Inject 0.4 mLs (40 Units total) into the skin daily. 07/16/20   [place] Ouch, [place]  M, PA-C  levothyroxine (SYNTHROID) 75 MCG tablet Take 75 mcg by mouth daily before breakfast.    [provider]  losartan (COZAAR) 100 MG tablet Take 100 mg by mouth every morning.    [provider]  metFORMIN (GLUCOPHAGE) 500 MG tablet Take 500 mg by mouth 2 (two) times daily.     [provider]  Multiple Vitamin (MULTIVITAMIN) capsule Take 1 capsule by mouth at bedtime.     [provider]  Multiple Vitamins-Minerals (ICAPS AREDS 2 PO) Take 1 tablet by mouth 2 (two) times a day.     [provider]  Potassium Chloride ER 20 MEQ TBCR Take 1 tablet by mouth daily. 12/24/19   [provider]  Tart Cherry 1200 MG CAPS Take by mouth.    [provider]    Family History Family  History  Problem Relation Age of Onset   Hypertension Father    Cirrhosis Brother    Diabetes Mellitus II Brother     Social History Social History   Tobacco Use   Smoking status: Never   Smokeless tobacco: Never  Vaping Use   Vaping status: Never Used  Substance Use Topics   Alcohol use: No    Alcohol/week: 0.0 standard drinks of alcohol   Drug use: No     Allergies   Ciprofloxacin and Vioxx [rofecoxib]   Review of Systems Review of Systems Per HPI  Physical Exam Triage Vital Signs ED Triage Vitals  Encounter Vitals Group     BP 03/13/23 1704 (!) 156/65     Systolic BP Percentile --      Diastolic BP Percentile --      Pulse Rate 03/13/23 1704 78     Resp 03/13/23 1704 16     Temp 03/13/23 1704 98.2 F (36.8 C)     Temp Source 03/13/23 1704 Oral     SpO2 03/13/23 1704 94 %     Weight --      Height --      Head Circumference --      Peak Flow --      Pain Score 03/13/23 1706 7     Pain Loc --      Pain Education --      Exclude from Growth Chart --    No data found.  Updated Vital Signs BP (!) 156/65 (BP Location: Right Arm)   Pulse 78   Temp 98.2 F (36.8 C) (Oral)   Resp 16   SpO2 94%   Visual Acuity Right Eye Distance:   Left Eye Distance:   Bilateral Distance:    Right Eye Near:   Left Eye Near:    Bilateral Near:     Physical Exam Vitals and nursing note reviewed.  Constitutional:      General: He is not in acute distress.    Appearance: Normal appearance.  HENT:     Head: Normocephalic.  Musculoskeletal:     Left wrist: Swelling and tenderness present. No deformity, effusion, snuff box tenderness or crepitus. Decreased range of motion. Normal pulse.     Left hand: Swelling present.     Cervical back: Normal range of motion.     Comments: Tenderness to general plane of the left wrist.  Decreased range of motion noted.  Generalized swelling noted to the left hand extending to the left wrist.  Skin:    General: Skin is warm and  dry.  Neurological:     General: No focal  deficit present.     Mental Status: He is alert and oriented to person, place, and time.  Psychiatric:        Mood and Affect: Mood normal.        Behavior: Behavior normal.      UC Treatments / Results  Labs (all labs ordered are listed, but only abnormal results are displayed) Labs Reviewed - No data to display  EKG   Radiology No results found.  Procedures Procedures (including critical care time)  Medications Ordered in UC Medications - No data to display  Initial Impression / Assessment and Plan / UC Course  I have reviewed the triage vital signs and the nursing notes.  Pertinent labs & imaging results that were available during my care of the patient were reviewed by me and considered in my medical decision making (see chart for details).  X-ray of the left wrist is pending.  Will place patient in a wrist brace while x-ray is pending.  Patient was advised if the x-ray does show a fracture, he will need to follow-up in this clinic to allow for splinting.  Supportive care recommendations were provided and discussed with the patient to include over-the-counter analgesics such as Tylenol, and RICE therapy.  Patient was advised that he will be contacted when the results of the x-ray are received.  Patient was also advised that if the wrist is sprained, and if symptoms do not improve over the next 2 to 3 weeks, would like for him to follow-up with orthopedics for further evaluation.  Patient was in agreement with this plan of care and verbalized understanding.  All questions were answered.  Patient stable for discharge.  Final Clinical Impressions(s) / UC Diagnoses   Final diagnoses:  Left wrist pain  Fall, initial encounter     Discharge Instructions      X-ray of the left wrist is pending.  You will be contacted when the x-ray result is received.  If the x-ray shows a fracture, you will need to follow-up in this clinic so we  can provide a splint for the left wrist. Wear the brace to provide additional compression and support.  If you do not have any fractures in your wrist, you will continue to wear the brace for prolonged or strenuous activity. RICE therapy, rest, ice, compression, and elevation.  Apply ice for 20 minutes, remove for 1 hour, repeat as needed. Gentle range of motion exercises of the left wrist as tolerated. Recommend over-the-counter Tylenol 650 mg tablets for left wrist pain. If symptoms fail to improve with this treatment, it is recommended that you follow-up with Dr. Hilda Lias if symptoms have not improved over the next 2 to 3 weeks. Follow-up as needed.      ED Prescriptions   None    PDMP not reviewed this encounter.   Abran Cantor, NP 03/13/23 1759

## 2023-03-14 ENCOUNTER — Telehealth: Payer: Self-pay | Admitting: Nurse Practitioner

## 2023-03-14 ENCOUNTER — Ambulatory Visit
Admission: EM | Admit: 2023-03-14 | Discharge: 2023-03-14 | Disposition: A | Discharge status: Home / Self Care | Payer: Medicare Other | Attending: Nurse Practitioner | Admitting: Nurse Practitioner

## 2023-03-14 ENCOUNTER — Encounter: Payer: Self-pay | Admitting: Emergency Medicine

## 2023-03-14 DIAGNOSIS — M25532 Pain in left wrist: Secondary | ICD-10-CM

## 2023-03-14 NOTE — ED Triage Notes (Signed)
Here for splint placement for fracture in left wrist

## 2023-03-14 NOTE — Telephone Encounter (Signed)
Call patient to discuss x-ray results.  Spoke with patient, verified 2 patient identifiers.  Advised patient that review of his x-ray shows there was a break in the left wrist.  Patient was advised to return to this clinic to provide a splint to the affected area.  Patient is in agreement and verbalized understanding.  Patient to return to the clinic for splinting and information for further follow-up and evaluation.  Patient verbalized understanding.  All questions were.

## 2023-03-16 ENCOUNTER — Encounter: Payer: Self-pay | Admitting: Orthopaedic Surgery

## 2023-03-16 ENCOUNTER — Ambulatory Visit (INDEPENDENT_AMBULATORY_CARE_PROVIDER_SITE_OTHER): Payer: Medicare Other | Admitting: Orthopaedic Surgery

## 2023-03-16 VITALS — Ht 65.0 in | Wt 199.0 lb

## 2023-03-16 DIAGNOSIS — S52532A Colles' fracture of left radius, initial encounter for closed fracture: Secondary | ICD-10-CM

## 2023-03-16 MED ORDER — HYDROCODONE-ACETAMINOPHEN 5-325 MG PO TABS: ORAL_TABLET | ORAL | 0 refills | Status: DC

## 2023-03-16 NOTE — Progress Notes (Signed)
Subjective:    Patient ID: Alexander Clayton, male    DOB: [birth year], 89 y.o.   MRN: [id number]  HPI He fell at home and hurt his left nondominant wrist on 03-12-23.  He was seen at urgent care.  X-rays showed: IMPRESSION: 1. Suspected minimally displaced intra-articular fracture dorsal lip of the distal left radius, with overlying soft tissue swelling. 2. Severe multifocal osteoarthritis.   He was placed in a sugar tong splint.  He has no other injury.  He is doing well but the plain Tylenol is not strong enough for his pain.  I will give medicine.   Review of Systems  Constitutional:  Positive for activity change.  HENT:  Positive for hearing loss.   Musculoskeletal:  Positive for arthralgias, gait problem and myalgias.  All other systems reviewed and are negative. For Review of Systems, all other systems reviewed and are negative.  The following is a summary of the past history medically, past history surgically, known current medicines, social history and family history.  This information is gathered electronically by the computer from prior information and documentation.  I review this each visit and have found including this information at this point in the chart is beneficial and informative.   Past Medical History:  Diagnosis Date   Bladder cancer (HCC)    CAD (coronary artery disease)    a. per coronary CTA 09/2018 - managed medically.   CKD (chronic kidney disease), stage III (HCC)    Diabetes mellitus without complication (HCC)    Hepatic steatosis    by CT 08/2018   Hiatal hernia    small by CT 08/2018   Hypercholesteremia    Hypertension     Past Surgical History:  Procedure Laterality Date   APPENDECTOMY     HERNIA REPAIR      Current Outpatient Medications on File Prior to Visit  Medication Sig Dispense Refill   acetaminophen (TYLENOL) 500 MG tablet Take 1,000 mg by mouth 3 (three) times daily.      amLODipine (NORVASC) 10 MG tablet Take 10 mg by  mouth every morning.      aspirin EC 81 MG tablet Take 81 mg by mouth in the morning and at bedtime.     atorvastatin (LIPITOR) 20 MG tablet Take 20 mg by mouth at bedtime.      cetirizine (ZYRTEC) 10 MG tablet Take 10 mg by mouth at bedtime.     Cholecalciferol (VITAMIN D-3) 125 MCG (5000 UT) TABS Take by mouth.     gabapentin (NEURONTIN) 300 MG capsule Take 300 mg by mouth 2 (two) times a day.     HUMALOG KWIKPEN 100 UNIT/ML KwikPen SMARTSIG:4 Unit(s) SUB-Q Twice Daily     indapamide (LOZOL) 2.5 MG tablet Take 2.5 mg by mouth every morning.      Insulin Aspart (NOVOLOG IJ) Inject into the skin.     insulin glargine (LANTUS) 100 UNIT/ML injection Inject 0.4 mLs (40 Units total) into the skin daily. 10 mL    levothyroxine (SYNTHROID) 75 MCG tablet Take 75 mcg by mouth daily before breakfast.     losartan (COZAAR) 100 MG tablet Take 100 mg by mouth every morning.     metFORMIN (GLUCOPHAGE) 500 MG tablet Take 500 mg by mouth 2 (two) times daily.      Multiple Vitamin (MULTIVITAMIN) capsule Take 1 capsule by mouth at bedtime.      Multiple Vitamins-Minerals (ICAPS AREDS 2 PO) Take 1 tablet by mouth 2 (two) times  a day.      Potassium Chloride ER 20 MEQ TBCR Take 1 tablet by mouth daily.     Tart Cherry 1200 MG CAPS Take by mouth.     No current facility-administered medications on file prior to visit.    Social History   Socioeconomic History   Marital status: Married    Spouse name: Not on file   Number of children: Not on file   Years of education: Not on file   Highest education level: Not on file  Occupational History   Not on file  Tobacco Use   Smoking status: Never   Smokeless tobacco: Never  Vaping Use   Vaping status: Never Used  Substance and Sexual Activity   Alcohol use: No    Alcohol/week: 0.0 standard drinks of alcohol   Drug use: No   Sexual activity: Not on file  Other Topics Concern   Not on file  Social History Narrative   Not on file   Social Determinants  of Health   Financial Resource Strain: Not on file  Food Insecurity: Not on file  Transportation Needs: Not on file  Physical Activity: Not on file  Stress: Not on file  Social Connections: Not on file  Intimate Partner Violence: Not on file    Family History  Problem Relation Age of Onset   Hypertension Father    Cirrhosis Brother    Diabetes Mellitus II Brother     Ht 5\' 5"  (1.651 m)   Wt 199 lb (90.3 kg)   BMI 33.12 kg/m   Body mass index is 33.12 kg/m.      Objective:   Physical Exam Vitals and nursing note reviewed. Exam conducted with a chaperone present.  Constitutional:      Appearance: He is well-developed.  HENT:     Head: Normocephalic and atraumatic.  Eyes:     Conjunctiva/sclera: Conjunctivae normal.     Pupils: Pupils are equal, round, and reactive to light.  Cardiovascular:     Rate and Rhythm: Normal rate and regular rhythm.  Pulmonary:     Effort: Pulmonary effort is normal.  Abdominal:     Palpations: Abdomen is soft.  Musculoskeletal:       Hands:     Cervical back: Normal range of motion and neck supple.  Skin:    General: Skin is warm and dry.  Neurological:     Mental Status: He is alert and oriented to person, place, and time.     Cranial Nerves: No cranial nerve deficit.     Motor: No abnormal muscle tone.     Coordination: Coordination normal.     Deep Tendon Reflexes: Reflexes are normal and symmetric. Reflexes normal.  Psychiatric:        Behavior: Behavior normal.        Thought Content: Thought content normal.        Judgment: Judgment normal.   I have reviewed the urgent care notes.  I have independently reviewed and interpreted x-rays of this patient done at another site by another physician or qualified health professional.  His splint was modified and reapplied.        Assessment & Plan:   Encounter Diagnosis  Name Primary?   Closed Colles' fracture of left radius, initial encounter Yes   I will call in pain  medicine.  I have reviewed the [place] Controlled Substance Reporting System web site prior to prescribing narcotic medicine for this patient.  Return in one  week.  Call if any problem.  Precautions discussed.  Electronically Signed Darreld Mclean, MD 11/21/20248:16 AM

## 2023-03-22 ENCOUNTER — Ambulatory Visit (INDEPENDENT_AMBULATORY_CARE_PROVIDER_SITE_OTHER): Payer: Medicare Other | Admitting: Orthopaedic Surgery

## 2023-03-22 ENCOUNTER — Other Ambulatory Visit (INDEPENDENT_AMBULATORY_CARE_PROVIDER_SITE_OTHER): Payer: Medicare Other

## 2023-03-22 ENCOUNTER — Encounter: Payer: Self-pay | Admitting: Orthopaedic Surgery

## 2023-03-22 DIAGNOSIS — S52532D Colles' fracture of left radius, subsequent encounter for closed fracture with routine healing: Secondary | ICD-10-CM

## 2023-03-22 DIAGNOSIS — S52532A Colles' fracture of left radius, initial encounter for closed fracture: Secondary | ICD-10-CM

## 2023-03-22 NOTE — Progress Notes (Signed)
I feel better.  He had splint removed.  NV intact.  He has no new trauma.  X-rays were done of the left wrist, reported separately.  Encounter Diagnosis  Name Primary?   Closed Colles' fracture of left radius with routine healing, subsequent encounter Yes   A new short arm cast applied.  Return in two weeks.  X-rays then in cast.  Call if any problem.  Precautions discussed.  Electronically Signed Darreld Mclean, MD 11/27/20249:07 AM

## 2023-03-29 DIAGNOSIS — E1122 Type 2 diabetes mellitus with diabetic chronic kidney disease: Secondary | ICD-10-CM | POA: Diagnosis not present

## 2023-03-29 DIAGNOSIS — E039 Hypothyroidism, unspecified: Secondary | ICD-10-CM | POA: Diagnosis not present

## 2023-03-29 DIAGNOSIS — I10 Essential (primary) hypertension: Secondary | ICD-10-CM | POA: Diagnosis not present

## 2023-03-29 DIAGNOSIS — C44519 Basal cell carcinoma of skin of other part of trunk: Secondary | ICD-10-CM | POA: Diagnosis not present

## 2023-03-29 DIAGNOSIS — E785 Hyperlipidemia, unspecified: Secondary | ICD-10-CM | POA: Diagnosis not present

## 2023-04-05 ENCOUNTER — Other Ambulatory Visit (INDEPENDENT_AMBULATORY_CARE_PROVIDER_SITE_OTHER): Payer: Medicare Other

## 2023-04-05 ENCOUNTER — Ambulatory Visit (INDEPENDENT_AMBULATORY_CARE_PROVIDER_SITE_OTHER): Payer: Medicare Other | Admitting: Orthopaedic Surgery

## 2023-04-05 ENCOUNTER — Encounter: Payer: Self-pay | Admitting: Orthopaedic Surgery

## 2023-04-05 DIAGNOSIS — S52532D Colles' fracture of left radius, subsequent encounter for closed fracture with routine healing: Secondary | ICD-10-CM

## 2023-04-05 NOTE — Progress Notes (Signed)
I feel good.  His cast is in good condition.  X-rays were done of the left wrist in the cast, reported separately.  NV intact.  Encounter Diagnosis  Name Primary?   Closed Colles' fracture of left radius with routine healing, subsequent encounter Yes   Return in one week.  X-rays then out of the cast.  Call if any problem.  Precautions discussed.  Electronically Signed Darreld Mclean, MD 12/11/20249:17 AM

## 2023-04-12 ENCOUNTER — Ambulatory Visit (INDEPENDENT_AMBULATORY_CARE_PROVIDER_SITE_OTHER): Payer: No Typology Code available for payment source | Admitting: Orthopaedic Surgery

## 2023-04-12 ENCOUNTER — Other Ambulatory Visit (INDEPENDENT_AMBULATORY_CARE_PROVIDER_SITE_OTHER): Payer: Medicare Other

## 2023-04-12 ENCOUNTER — Encounter: Payer: Self-pay | Admitting: Orthopaedic Surgery

## 2023-04-12 DIAGNOSIS — Z961 Presence of intraocular lens: Secondary | ICD-10-CM | POA: Diagnosis not present

## 2023-04-12 DIAGNOSIS — S52532D Colles' fracture of left radius, subsequent encounter for closed fracture with routine healing: Secondary | ICD-10-CM

## 2023-04-12 DIAGNOSIS — M79641 Pain in right hand: Secondary | ICD-10-CM

## 2023-04-12 DIAGNOSIS — E119 Type 2 diabetes mellitus without complications: Secondary | ICD-10-CM | POA: Diagnosis not present

## 2023-04-12 DIAGNOSIS — H353134 Nonexudative age-related macular degeneration, bilateral, advanced atrophic with subfoveal involvement: Secondary | ICD-10-CM | POA: Diagnosis not present

## 2023-04-12 DIAGNOSIS — H43813 Vitreous degeneration, bilateral: Secondary | ICD-10-CM | POA: Diagnosis not present

## 2023-04-12 NOTE — Addendum Note (Signed)
Addended by: Michaele Offer on: 04/12/2023 08:43 AM   Modules accepted: Orders

## 2023-04-12 NOTE — Progress Notes (Signed)
X-rays were done out of the cast.  He has less pain.  NV intact.  X-rays reported separately.  He has some numbness of the right hand consistent with carpal tunnel.  I will have him see Dr. Alvester Morin for EMGs.  Encounter Diagnosis  Name Primary?   Closed Colles' fracture of left radius with routine healing, subsequent encounter Yes   Return in three weeks.  He was placed in cock-up splint.  X-rays on return.  Call if any problem.  Precautions discussed.  Electronically Signed Darreld Mclean, MD 12/18/20248:27 AM

## 2023-04-17 DIAGNOSIS — D225 Melanocytic nevi of trunk: Secondary | ICD-10-CM | POA: Diagnosis not present

## 2023-04-17 DIAGNOSIS — X32XXXD Exposure to sunlight, subsequent encounter: Secondary | ICD-10-CM | POA: Diagnosis not present

## 2023-04-17 DIAGNOSIS — C44529 Squamous cell carcinoma of skin of other part of trunk: Secondary | ICD-10-CM | POA: Diagnosis not present

## 2023-04-17 DIAGNOSIS — L570 Actinic keratosis: Secondary | ICD-10-CM | POA: Diagnosis not present

## 2023-05-01 DIAGNOSIS — C44529 Squamous cell carcinoma of skin of other part of trunk: Secondary | ICD-10-CM | POA: Diagnosis not present

## 2023-05-01 DIAGNOSIS — L988 Other specified disorders of the skin and subcutaneous tissue: Secondary | ICD-10-CM | POA: Diagnosis not present

## 2023-05-03 ENCOUNTER — Ambulatory Visit: Payer: Medicare Other | Admitting: Orthopaedic Surgery

## 2023-05-03 ENCOUNTER — Other Ambulatory Visit (INDEPENDENT_AMBULATORY_CARE_PROVIDER_SITE_OTHER): Payer: Medicare Other

## 2023-05-03 ENCOUNTER — Encounter: Payer: Self-pay | Admitting: Orthopaedic Surgery

## 2023-05-03 DIAGNOSIS — S52532D Colles' fracture of left radius, subsequent encounter for closed fracture with routine healing: Secondary | ICD-10-CM | POA: Diagnosis not present

## 2023-05-03 DIAGNOSIS — G5601 Carpal tunnel syndrome, right upper limb: Secondary | ICD-10-CM

## 2023-05-03 NOTE — Progress Notes (Signed)
 My wrist is better.  His left wrist has good ROM and no pain.  He has carpal tunnel syndrome symptoms on the right and he has EMGs scheduled next week.  I will see him in two weeks for that.  X-rays were done of the left wrist, reported separately.  Encounter Diagnoses  Name Primary?   Closed Colles' fracture of left radius with routine healing, subsequent encounter Yes   Carpal tunnel syndrome, right upper limb    Return in two weeks to review EMGs.  Call if any problem.  Precautions discussed.  Electronically Signed Lemond Stable, MD 1/8/20258:20 AM

## 2023-05-09 ENCOUNTER — Ambulatory Visit: Payer: Medicare Other | Admitting: Physical Medicine and Rehabilitation

## 2023-05-09 DIAGNOSIS — E119 Type 2 diabetes mellitus without complications: Secondary | ICD-10-CM

## 2023-05-09 DIAGNOSIS — M25531 Pain in right wrist: Secondary | ICD-10-CM | POA: Diagnosis not present

## 2023-05-09 DIAGNOSIS — M79641 Pain in right hand: Secondary | ICD-10-CM | POA: Diagnosis not present

## 2023-05-09 DIAGNOSIS — R202 Paresthesia of skin: Secondary | ICD-10-CM | POA: Diagnosis not present

## 2023-05-09 DIAGNOSIS — R531 Weakness: Secondary | ICD-10-CM | POA: Diagnosis not present

## 2023-05-09 NOTE — Progress Notes (Signed)
 Functional Pain Scale - descriptive words and definitions  Moderate (4)   Constantly aware of pain, can complete ADLs with modification/sleep marginally affected at times/passive distraction is of no use, but active distraction gives some relief. Moderate range order  Average Pain 5  RUE NCS, Numbness in fingers and R hand. He has difficultly grasping, dropping things, and buttoning his buttons on shirt.

## 2023-05-15 ENCOUNTER — Encounter: Payer: Self-pay | Admitting: Physical Medicine and Rehabilitation

## 2023-05-15 DIAGNOSIS — E119 Type 2 diabetes mellitus without complications: Secondary | ICD-10-CM | POA: Insufficient documentation

## 2023-05-15 NOTE — Progress Notes (Signed)
 Alexander Clayton - 89 y.o. male MRN [id number]  Date of birth: November 23, 1927  Office Visit Note: Visit Date: 05/09/2023 PCP: Sheryle Carwin, MD Referred by: Brenna Lin, MD  Subjective: Chief Complaint  Patient presents with   Right Hand - Numbness   HPI: Alexander Clayton is a 89 y.o. male who comes in today at the request of Dr. Lin Brenna for evaluation and management of chronic, worsening and severe pain, numbness and tingling in the Right upper extremities.  Patient is Right hand dominant.  He is status post closed Colles' fracture of left radius after injury on 03/12/2023.  He has been under the care of Dr. Brenna and this is improved significantly.  Now having right sided pain numbness and tingling in the radial digits with focal weakness.  Get some referral of symptoms up into the elbow at times.  Has noticed difficulty with the hand particularly with driving and talking on the phone.  He does get some nocturnal complaints.  He reports this is really worsened over the last several months but is ongoing for some time off and on.  He does have some neck pain but no real frank radicular symptoms down the arms.  His case is complicated by insulin -dependent diabetes.  He does not carry a diagnosis of polyneuropathy.   I spent more than 30 minutes speaking face-to-face with the patient with 50% of the time in counseling and discussing coordination of care.      Review of Systems  Musculoskeletal:  Positive for joint pain and neck pain.  Neurological:  Positive for tingling and focal weakness.  All other systems reviewed and are negative.  Otherwise per HPI.  Assessment & Plan: Visit Diagnoses:    ICD-10-CM   1. Paresthesia of skin  R20.2 NCV with EMG (electromyography)    2. Pain in right hand  M79.641     3. Pain in right wrist  M25.531     4. Weakness  R53.1     5. Diabetes mellitus without complication (HCC)  E11.9        Plan: Impression: Clinically this appears  to be a severe median nerve neuropathy at the wrist or carpal tunnel syndrome.  Electrodiagnostic study performed today.  The above electrodiagnostic study is ABNORMAL and reveals evidence of a severe right median nerve entrapment at the wrist (carpal tunnel syndrome) affecting sensory and motor components. The lesion is characterized by sensory and motor demyelination with evidence of significant axonal injury.  Rare motor unit action potential portends guarded prognosis even with decompression.  There is no significant electrodiagnostic evidence of any other focal nerve entrapment, brachial plexopathy or cervical radiculopathy.   Recommendations: 1.  Follow-up with referring physician. 2.  Continue current management of symptoms. 3.  Suggest surgical evaluation.  Meds & Orders: No orders of the defined types were placed in this encounter.   Orders Placed This Encounter  Procedures   NCV with EMG (electromyography)    Follow-up: Return for Lin Brenna, MD.   Procedures: No procedures performed  EMG & NCV Findings: Evaluation of the right median motor nerve showed prolonged distal onset latency (5.2 ms), reduced amplitude (0.2 mV), and decreased conduction velocity (Elbow-Wrist, 44 m/s).  The right median (across palm) sensory nerve showed no response (Wrist) and no response (Palm).  The right ulnar sensory nerve showed reduced amplitude (8.4 V).  All remaining nerves (as indicated in the following tables) were within normal limits.    Needle evaluation of the  right abductor pollicis brevis muscle showed decreased insertional activity, widespread spontaneous activity, decreased motor unit amplitude, and diminished recruitment.  All remaining muscles (as indicated in the following table) showed no evidence of electrical instability.    Impression: The above electrodiagnostic study is ABNORMAL and reveals evidence of a severe right median nerve entrapment at the wrist (carpal tunnel  syndrome) affecting sensory and motor components. The lesion is characterized by sensory and motor demyelination with evidence of significant axonal injury.  Rare motor unit action potential portends guarded prognosis even with decompression.  There is no significant electrodiagnostic evidence of any other focal nerve entrapment, brachial plexopathy or cervical radiculopathy.   Recommendations: 1.  Follow-up with referring physician. 2.  Continue current management of symptoms. 3.  Suggest surgical evaluation.  ___________________________ Prentice Masters FAAPMR Board Certified, American Board of Physical Medicine and Rehabilitation    Nerve Conduction Studies Anti Sensory Summary Table   Stim Site NR Peak (ms) Norm Peak (ms) P-T Amp (V) Norm P-T Amp Site1 Site2 Delta-P (ms) Dist (cm) Vel (m/s) Norm Vel (m/s)  Right Median Acr Palm Anti Sensory (2nd Digit)  28.9C  Wrist *NR  <3.6  >10 Wrist Palm  0.0    Palm *NR  <2.0          Right Radial Anti Sensory (Base 1st Digit)  28.7C  Wrist    2.2 <3.1 14.5  Wrist Base 1st Digit 2.2 0.0    Right Ulnar Anti Sensory (5th Digit)  29.1C  Wrist    3.7 <3.7 *8.4 >15.0 Wrist 5th Digit 3.7 14.0 38 >38   Motor Summary Table   Stim Site NR Onset (ms) Norm Onset (ms) O-P Amp (mV) Norm O-P Amp Site1 Site2 Delta-0 (ms) Dist (cm) Vel (m/s) Norm Vel (m/s)  Right Median Motor (Abd Poll Brev)  28.6C  Wrist    *5.2 <4.2 *0.2 >5 Elbow Wrist 5.1 22.5 *44 >50  Elbow    10.3  1.4         Right Ulnar Motor (Abd Dig Min)  28.8C  Wrist    3.2 <4.2 7.7 >3 B Elbow Wrist 3.7 20.0 54 >53  B Elbow    6.9  6.5  A Elbow B Elbow 1.2 10.0 83 >53  A Elbow    8.1  6.4          EMG   Side Muscle Nerve Root Ins Act Fibs Psw Amp Dur Poly Recrt Int Bruna Comment  Right Abd Poll Brev Median C8-T1 *Decr *4+ *4+ *Decr Nml 0 *Reduced Nml   Right 1stDorInt Ulnar C8-T1 Nml Nml Nml Nml Nml 0 Nml Nml   Right PronatorTeres Median C6-7 Nml Nml Nml Nml Nml 0 Nml Nml   Right Biceps  Musculocut C5-6 Nml Nml Nml Nml Nml 0 Nml Nml     Nerve Conduction Studies Anti Sensory Left/Right Comparison   Stim Site L Lat (ms) R Lat (ms) L-R Lat (ms) L Amp (V) R Amp (V) L-R Amp (%) Site1 Site2 L Vel (m/s) R Vel (m/s) L-R Vel (m/s)  Median Acr Palm Anti Sensory (2nd Digit)  28.9C  Wrist       Wrist Palm     Palm             Radial Anti Sensory (Base 1st Digit)  28.7C  Wrist  2.2   14.5  Wrist Base 1st Digit     Ulnar Anti Sensory (5th Digit)  29.1C  Wrist  3.7   *8.4  Wrist 5th Digit  38    Motor Left/Right Comparison   Stim Site L Lat (ms) R Lat (ms) L-R Lat (ms) L Amp (mV) R Amp (mV) L-R Amp (%) Site1 Site2 L Vel (m/s) R Vel (m/s) L-R Vel (m/s)  Median Motor (Abd Poll Brev)  28.6C  Wrist  *5.2   *0.2  Elbow Wrist  *44   Elbow  10.3   1.4        Ulnar Motor (Abd Dig Min)  28.8C  Wrist  3.2   7.7  B Elbow Wrist  54   B Elbow  6.9   6.5  A Elbow B Elbow  83   A Elbow  8.1   6.4           Waveforms:            Clinical History: No specialty comments available.   He reports that he has never smoked. He has never used smokeless tobacco. No results for input(s): HGBA1C, LABURIC in the last 8760 hours.  Objective:  VS:  HT:    WT:   BMI:     BP:   HR: bpm  TEMP: ( )  RESP:  Physical Exam Vitals and nursing note reviewed.  Constitutional:      General: He is not in acute distress.    Appearance: Normal appearance. He is well-developed.  HENT:     Head: Normocephalic and atraumatic.  Eyes:     Conjunctiva/sclera: Conjunctivae normal.     Pupils: Pupils are equal, round, and reactive to light.  Cardiovascular:     Rate and Rhythm: Normal rate.     Pulses: Normal pulses.     Heart sounds: Normal heart sounds.  Pulmonary:     Effort: Pulmonary effort is normal. No respiratory distress.  Musculoskeletal:        General: Tenderness present. No swelling.     Cervical back: Normal range of motion and neck supple. No rigidity.     Right lower leg: No  edema.     Left lower leg: No edema.     Comments: Inspection reveals flattening of the right APB but no atrophy of the bilateral APB or FDI or hand intrinsics. There is no swelling, color changes, allodynia or dystrophic changes. There is 5 out of 5 strength in the bilateral wrist extension, finger abduction and long finger flexion.  There is decreased sensation to light touch in the right median nerve distribution. There is a negative Tinel's test at the bilateral wrist and elbow. There is a positive Phalen's test on the right. There is a negative Hoffmann's test bilaterally.  Skin:    General: Skin is warm and dry.     Findings: No erythema or rash.  Neurological:     General: No focal deficit present.     Mental Status: He is alert and oriented to person, place, and time.     Cranial Nerves: No cranial nerve deficit.     Sensory: Sensory deficit present.     Motor: Weakness present. No abnormal muscle tone.     Coordination: Coordination normal.     Gait: Gait normal.  Psychiatric:        Mood and Affect: Mood normal.        Behavior: Behavior normal.        Thought Content: Thought content normal.     Ortho Exam  Imaging: No results found.  Past Medical/Family/Surgical/Social History: Medications & Allergies reviewed per EMR, new  medications updated. Patient Active Problem List   Diagnosis Date Noted   Diabetes mellitus without complication (HCC)    Fracture, Colles, left, closed 03/22/2023   Hypothyroidism 01/25/2022   Age-related macular degeneration 01/25/2022   Focal neurological deficit 12/16/2019   CKD (chronic kidney disease) stage 3, GFR 30-59 ml/min 09/04/2018   Essential hypertension 09/04/2018   Mixed hyperlipidemia 09/04/2018   Chest pain 09/03/2018   Osteoarthritis of left knee 06/07/2017   Benign prostate hyperplasia 05/09/2011   Past Medical History:  Diagnosis Date   Bladder cancer (HCC)    CAD (coronary artery disease)    a. per coronary CTA 09/2018 -  managed medically.   CKD (chronic kidney disease), stage III (HCC)    Diabetes mellitus without complication (HCC)    Hepatic steatosis    by CT 08/2018   Hiatal hernia    small by CT 08/2018   Hypercholesteremia    Hypertension    Family History  Problem Relation Age of Onset   Hypertension Father    Cirrhosis Brother    Diabetes Mellitus II Brother    Past Surgical History:  Procedure Laterality Date   APPENDECTOMY     HERNIA REPAIR     Social History   Occupational History   Not on file  Tobacco Use   Smoking status: Never   Smokeless tobacco: Never  Vaping Use   Vaping status: Never Used  Substance and Sexual Activity   Alcohol use: No    Alcohol/week: 0.0 standard drinks of alcohol   Drug use: No   Sexual activity: Not on file

## 2023-05-15 NOTE — Procedures (Signed)
 EMG & NCV Findings: Evaluation of the right median motor nerve showed prolonged distal onset latency (5.2 ms), reduced amplitude (0.2 mV), and decreased conduction velocity (Elbow-Wrist, 44 m/s).  The right median (across palm) sensory nerve showed no response (Wrist) and no response (Palm).  The right ulnar sensory nerve showed reduced amplitude (8.4 V).  All remaining nerves (as indicated in the following tables) were within normal limits.    Needle evaluation of the right abductor pollicis brevis muscle showed decreased insertional activity, widespread spontaneous activity, decreased motor unit amplitude, and diminished recruitment.  All remaining muscles (as indicated in the following table) showed no evidence of electrical instability.    Impression: The above electrodiagnostic study is ABNORMAL and reveals evidence of a severe right median nerve entrapment at the wrist (carpal tunnel syndrome) affecting sensory and motor components. The lesion is characterized by sensory and motor demyelination with evidence of significant axonal injury.  Rare motor unit action potential portends guarded prognosis even with decompression.  There is no significant electrodiagnostic evidence of any other focal nerve entrapment, brachial plexopathy or cervical radiculopathy.   Recommendations: 1.  Follow-up with referring physician. 2.  Continue current management of symptoms. 3.  Suggest surgical evaluation.  ___________________________ Prentice Masters FAAPMR Board Certified, American Board of Physical Medicine and Rehabilitation    Nerve Conduction Studies Anti Sensory Summary Table   Stim Site NR Peak (ms) Norm Peak (ms) P-T Amp (V) Norm P-T Amp Site1 Site2 Delta-P (ms) Dist (cm) Vel (m/s) Norm Vel (m/s)  Right Median Acr Palm Anti Sensory (2nd Digit)  28.9C  Wrist *NR  <3.6  >10 Wrist Palm  0.0    Palm *NR  <2.0          Right Radial Anti Sensory (Base 1st Digit)  28.7C  Wrist    2.2 <3.1 14.5   Wrist Base 1st Digit 2.2 0.0    Right Ulnar Anti Sensory (5th Digit)  29.1C  Wrist    3.7 <3.7 *8.4 >15.0 Wrist 5th Digit 3.7 14.0 38 >38   Motor Summary Table   Stim Site NR Onset (ms) Norm Onset (ms) O-P Amp (mV) Norm O-P Amp Site1 Site2 Delta-0 (ms) Dist (cm) Vel (m/s) Norm Vel (m/s)  Right Median Motor (Abd Poll Brev)  28.6C  Wrist    *5.2 <4.2 *0.2 >5 Elbow Wrist 5.1 22.5 *44 >50  Elbow    10.3  1.4         Right Ulnar Motor (Abd Dig Min)  28.8C  Wrist    3.2 <4.2 7.7 >3 B Elbow Wrist 3.7 20.0 54 >53  B Elbow    6.9  6.5  A Elbow B Elbow 1.2 10.0 83 >53  A Elbow    8.1  6.4          EMG   Side Muscle Nerve Root Ins Act Fibs Psw Amp Dur Poly Recrt Int Bruna Comment  Right Abd Poll Brev Median C8-T1 *Decr *4+ *4+ *Decr Nml 0 *Reduced Nml   Right 1stDorInt Ulnar C8-T1 Nml Nml Nml Nml Nml 0 Nml Nml   Right PronatorTeres Median C6-7 Nml Nml Nml Nml Nml 0 Nml Nml   Right Biceps Musculocut C5-6 Nml Nml Nml Nml Nml 0 Nml Nml     Nerve Conduction Studies Anti Sensory Left/Right Comparison   Stim Site L Lat (ms) R Lat (ms) L-R Lat (ms) L Amp (V) R Amp (V) L-R Amp (%) Site1 Site2 L Vel (m/s) R Vel (m/s) L-R Vel (  m/s)  Median Acr Palm Anti Sensory (2nd Digit)  28.9C  Wrist       Wrist Palm     Palm             Radial Anti Sensory (Base 1st Digit)  28.7C  Wrist  2.2   14.5  Wrist Base 1st Digit     Ulnar Anti Sensory (5th Digit)  29.1C  Wrist  3.7   *8.4  Wrist 5th Digit  38    Motor Left/Right Comparison   Stim Site L Lat (ms) R Lat (ms) L-R Lat (ms) L Amp (mV) R Amp (mV) L-R Amp (%) Site1 Site2 L Vel (m/s) R Vel (m/s) L-R Vel (m/s)  Median Motor (Abd Poll Brev)  28.6C  Wrist  *5.2   *0.2  Elbow Wrist  *44   Elbow  10.3   1.4        Ulnar Motor (Abd Dig Min)  28.8C  Wrist  3.2   7.7  B Elbow Wrist  54   B Elbow  6.9   6.5  A Elbow B Elbow  83   A Elbow  8.1   6.4           Waveforms:

## 2023-05-17 ENCOUNTER — Ambulatory Visit: Payer: Medicare Other | Admitting: Orthopaedic Surgery

## 2023-05-17 ENCOUNTER — Encounter: Payer: Self-pay | Admitting: Orthopaedic Surgery

## 2023-05-17 VITALS — BP 139/79 | HR 68 | Ht 65.0 in | Wt 199.0 lb

## 2023-05-17 DIAGNOSIS — S52532D Colles' fracture of left radius, subsequent encounter for closed fracture with routine healing: Secondary | ICD-10-CM

## 2023-05-17 DIAGNOSIS — G5601 Carpal tunnel syndrome, right upper limb: Secondary | ICD-10-CM

## 2023-05-17 NOTE — Progress Notes (Signed)
I got that study done  He had EMGs by Dr. Alvester Morin.  He has severe carpal tunnel on the right.  I have reviewed the reports.  I have recommended carpal tunnel surgery.  I will have him see Dr. Dallas Schimke or Dr. Romeo Apple for this.  His left wrist is doing well from the fracture.  He has no pain, full ROM, NV intact.  He has decreased sensation of the median nerve on the right.  ROM is full.  Encounter Diagnoses  Name Primary?   Carpal tunnel syndrome, right upper limb Yes   Closed Colles' fracture of left radius with routine healing, subsequent encounter    To see Dr. Salena Saner or Dr. Rexene Edison for outpatient surgery.  Call if any problem.  Precautions discussed.  Electronically Signed Darreld Mclean, MD 1/22/20258:24 AM

## 2023-05-29 NOTE — Progress Notes (Signed)
  Intake history:  BP (!) 149/76   Pulse 79   Ht 5' 7 (1.702 m)   Wt 200 lb (90.7 kg)   BMI 31.32 kg/m  Body mass index is 31.32 kg/m.    WHAT ARE WE SEEING YOU FOR TODAY?   right hand(s)  How long has this bothered you? (DOI?DOS?WS?) fracture 03/13/23 left wrist also has left hand wrist pain     Anticoag.  No  Diabetes Yes  Heart disease Yes CAD   Hypertension Yes  SMOKING HX No  Kidney disease No CMP     Component Value Date/Time   NA 135 11/05/2022 1716   NA 137 11/04/2022 1508   K 3.1 (L) 11/05/2022 1716   CL 96 (L) 11/05/2022 1716   CO2 26 11/05/2022 1716   GLUCOSE 131 (H) 11/05/2022 1716   BUN 23 11/05/2022 1716   BUN 18 11/04/2022 1508   CREATININE 1.17 11/05/2022 1716   CALCIUM  9.2 11/05/2022 1716   PROT 8.4 (H) 11/05/2022 1716   PROT 7.2 11/04/2022 1508   ALBUMIN 4.0 11/05/2022 1716   ALBUMIN 4.1 11/04/2022 1508   AST 20 11/05/2022 1716   ALT 19 11/05/2022 1716   ALKPHOS 103 11/05/2022 1716   BILITOT 0.8 11/05/2022 1716   BILITOT 0.6 11/04/2022 1508   EGFR 63 11/04/2022 1508   GFRNONAA 58 (L) 11/05/2022 1716    Any ALLERGIES ______________________________________________   Treatment:  Have you taken:  Tylenol  No  Advil  No  Had PT No  Had injection No  Other  _________________________

## 2023-05-31 ENCOUNTER — Encounter: Payer: Self-pay | Admitting: Orthopedic Surgery

## 2023-05-31 ENCOUNTER — Ambulatory Visit (INDEPENDENT_AMBULATORY_CARE_PROVIDER_SITE_OTHER): Payer: Medicare Other | Admitting: Orthopedic Surgery

## 2023-05-31 VITALS — BP 149/76 | HR 79 | Ht 67.0 in | Wt 200.0 lb

## 2023-05-31 DIAGNOSIS — R202 Paresthesia of skin: Secondary | ICD-10-CM | POA: Diagnosis not present

## 2023-05-31 DIAGNOSIS — M79642 Pain in left hand: Secondary | ICD-10-CM | POA: Diagnosis not present

## 2023-05-31 DIAGNOSIS — G5601 Carpal tunnel syndrome, right upper limb: Secondary | ICD-10-CM | POA: Diagnosis not present

## 2023-05-31 DIAGNOSIS — G5602 Carpal tunnel syndrome, left upper limb: Secondary | ICD-10-CM

## 2023-05-31 NOTE — Progress Notes (Signed)
 Orthopedics        Chief Complaint  Patient presents with   Carpal Tunnel    Right    Hand Pain    Left     89 year old male presents for possible surgical intervention for right carpal tunnel syndrome diagnosed with EMG testing status post left wrist fracture actually complains more of left-sided numbness tingling thumb index and long finger with night pain waking him up relieved by walking and placing the arm in the dependent position  The right upper extremity symptoms are actually minor  He does have diabetes he does have thyroid  disease  He was splint on the right hand for approximately  3 weeks with no relief  Left upper extremity decreased sensation thumb index and long finger full range of motion no atrophy  Recommend left upper extremity nerve conduction study  Return to office for review and discuss surgical options  Encounter Diagnoses  Name Primary?   Pain in left hand Yes   Paresthesias in left hand    Carpal tunnel syndrome, right upper limb    Carpal tunnel syndrome of left wrist

## 2023-05-31 NOTE — Patient Instructions (Signed)
 We are referring you to [facility] from [facility] address is [address] [place] The phone number is [phone]  The office will call you with an appointment Dr. Alvester Morin

## 2023-06-12 ENCOUNTER — Telehealth: Payer: Self-pay | Admitting: Physical Medicine and Rehabilitation

## 2023-06-12 NOTE — Telephone Encounter (Signed)
Pt called requesting to reschedule appt with Dr Alvester Morin. Please call pt at [phone].

## 2023-06-14 ENCOUNTER — Encounter: Payer: Medicare Other | Admitting: Physical Medicine and Rehabilitation

## 2023-06-20 ENCOUNTER — Ambulatory Visit: Payer: Medicare Other | Admitting: Physical Medicine and Rehabilitation

## 2023-06-20 DIAGNOSIS — M79642 Pain in left hand: Secondary | ICD-10-CM

## 2023-06-20 DIAGNOSIS — R202 Paresthesia of skin: Secondary | ICD-10-CM | POA: Diagnosis not present

## 2023-06-20 DIAGNOSIS — S52532S Colles' fracture of left radius, sequela: Secondary | ICD-10-CM

## 2023-06-20 NOTE — Progress Notes (Unsigned)
 Pain Score---2 Patient advising he has numbness and tingling in his left hand, His pain is mostly at night.

## 2023-06-21 NOTE — Procedures (Unsigned)
 EMG & NCV Findings: Evaluation of the left median motor nerve showed prolonged distal onset latency (8.3 ms), reduced amplitude (3.6 mV), and decreased conduction velocity (Elbow-Wrist, 42 m/s).  The left median (across palm) sensory nerve showed no response (Wrist) and no response (Palm).  All remaining nerves (as indicated in the following tables) were within normal limits.    Needle evaluation of the left abductor pollicis brevis muscle showed increased insertional activity, moderately increased spontaneous activity, and diminished recruitment.  All remaining muscles (as indicated in the following table) showed no evidence of electrical instability.    Impression: The above electrodiagnostic study is ABNORMAL and reveals evidence of a severe left median nerve entrapment at the wrist (carpal tunnel syndrome) affecting sensory and motor components.   There is no significant electrodiagnostic evidence of any other focal nerve entrapment, brachial plexopathy or cervical radiculopathy.   Recommendations: 1.  Follow-up with referring physician. 2.  Continue current management of symptoms. 3.  Suggest surgical evaluation.  ___________________________ Naaman Plummer FAAPMR Board Certified, American Board of Physical Medicine and Rehabilitation    Nerve Conduction Studies Anti Sensory Summary Table   Stim Site NR Peak (ms) Norm Peak (ms) P-T Amp (V) Norm P-T Amp Site1 Site2 Delta-P (ms) Dist (cm) Vel (m/s) Norm Vel (m/s)  Left Median Acr Palm Anti Sensory (2nd Digit)  31.1C  Wrist *NR  <3.6  >10 Wrist Palm  0.0    Palm *NR  <2.0          Left Radial Anti Sensory (Base 1st Digit)  30.9C  Wrist    2.3 <3.1 18.6  Wrist Base 1st Digit 2.3 0.0    Left Ulnar Anti Sensory (5th Digit)  31.3C  Wrist    3.6 <3.7 20.8 >15.0 Wrist 5th Digit 3.6 14.0 39 >38   Motor Summary Table   Stim Site NR Onset (ms) Norm Onset (ms) O-P Amp (mV) Norm O-P Amp Site1 Site2 Delta-0 (ms) Dist (cm) Vel (m/s) Norm Vel  (m/s)  Left Median Motor (Abd Poll Brev)  31.1C  Wrist    *8.3 <4.2 *3.6 >5 Elbow Wrist 5.1 21.5 *42 >50  Elbow    13.4  2.1         Left Ulnar Motor (Abd Dig Min)  31.3C  Wrist    3.1 <4.2 6.2 >3 B Elbow Wrist 3.6 20.0 56 >53  B Elbow    6.7  5.8  A Elbow B Elbow 1.3 10.0 77 >53  A Elbow    8.0  5.4          EMG   Side Muscle Nerve Root Ins Act Fibs Psw Amp Dur Poly Recrt Int Dennie Bible Comment  Left Abd Poll Brev Median C8-T1 *CRD *2+ *2+ Nml Nml 0 *Reduced Nml good MUAP  Left 1stDorInt Ulnar C8-T1 Nml Nml Nml Nml Nml 0 Nml Nml   Left PronatorTeres Median C6-7 Nml Nml Nml Nml Nml 0 Nml Nml     Nerve Conduction Studies Anti Sensory Left/Right Comparison   Stim Site L Lat (ms) R Lat (ms) L-R Lat (ms) L Amp (V) R Amp (V) L-R Amp (%) Site1 Site2 L Vel (m/s) R Vel (m/s) L-R Vel (m/s)  Median Acr Palm Anti Sensory (2nd Digit)  31.1C  Wrist       Wrist Palm     Palm             Radial Anti Sensory (Base 1st Digit)  30.9C  Wrist 2.3   18.6  Wrist Base 1st Digit     Ulnar Anti Sensory (5th Digit)  31.3C  Wrist 3.6   20.8   Wrist 5th Digit 39     Motor Left/Right Comparison   Stim Site L Lat (ms) R Lat (ms) L-R Lat (ms) L Amp (mV) R Amp (mV) L-R Amp (%) Site1 Site2 L Vel (m/s) R Vel (m/s) L-R Vel (m/s)  Median Motor (Abd Poll Brev)  31.1C  Wrist *8.3   *3.6   Elbow Wrist *42    Elbow 13.4   2.1         Ulnar Motor (Abd Dig Min)  31.3C  Wrist 3.1   6.2   B Elbow Wrist 56    B Elbow 6.7   5.8   A Elbow B Elbow 77    A Elbow 8.0   5.4            Waveforms:

## 2023-06-22 DIAGNOSIS — E1129 Type 2 diabetes mellitus with other diabetic kidney complication: Secondary | ICD-10-CM | POA: Diagnosis not present

## 2023-06-23 NOTE — Progress Notes (Signed)
 Alexander Clayton - 89 y.o. male MRN [id number]  Date of birth: May 28, 1927  Office Visit Note: Visit Date: 06/20/2023 PCP: Carylon Perches, MD Referred by: Vickki Hearing, MD  Subjective: Chief Complaint  Patient presents with   Left Hand - Pain, Weakness, Numbness   HPI:  Alexander Clayton is a 89 y.o. male who comes in today at the request of Dr. Fuller Canada for evaluation and management of chronic, worsening and severe pain, numbness and tingling in the Left upper extremities.  Patient is Right hand dominant.  Patient had previous electrodiagnostic study in our office a short time ago showing severe median neuropathy at the right wrist.  He now is reporting similar symptoms on the left.  I completed the test on the left today.  His case is complicated by thyroid disease and diabetes.   ROS Otherwise per HPI.  Assessment & Plan: Visit Diagnoses:    ICD-10-CM   1. Paresthesias in left hand  R20.2 NCV with EMG (electromyography)    2. Pain in left hand  M79.642 NCV with EMG (electromyography)    3. Closed Colles' fracture of left radius, sequela  S52.532S NCV with EMG (electromyography)      Plan: Impression: The above electrodiagnostic study is ABNORMAL and reveals evidence of a severe left median nerve entrapment at the wrist (carpal tunnel syndrome) affecting sensory and motor components.  Although electrodiagnostically considered severe the left side is not as severe as the right.  There is no significant electrodiagnostic evidence of any other focal nerve entrapment, brachial plexopathy or cervical radiculopathy.   Recommendations: 1.  Follow-up with referring physician. 2.  Continue current management of symptoms. 3.  Suggest surgical evaluation.  Meds & Orders: No orders of the defined types were placed in this encounter.   Orders Placed This Encounter  Procedures   NCV with EMG (electromyography)    Follow-up: Return for Fuller Canada, MD.    Procedures: No procedures performed  EMG & NCV Findings: Evaluation of the left median motor nerve showed prolonged distal onset latency (8.3 ms), reduced amplitude (3.6 mV), and decreased conduction velocity (Elbow-Wrist, 42 m/s).  The left median (across palm) sensory nerve showed no response (Wrist) and no response (Palm).  All remaining nerves (as indicated in the following tables) were within normal limits.    Needle evaluation of the left abductor pollicis brevis muscle showed increased insertional activity, moderately increased spontaneous activity, and diminished recruitment.  All remaining muscles (as indicated in the following table) showed no evidence of electrical instability.    Impression: The above electrodiagnostic study is ABNORMAL and reveals evidence of a severe left median nerve entrapment at the wrist (carpal tunnel syndrome) affecting sensory and motor components.  Although electrodiagnostically considered severe the left side is not as severe as the right.  There is no significant electrodiagnostic evidence of any other focal nerve entrapment, brachial plexopathy or cervical radiculopathy.   Recommendations: 1.  Follow-up with referring physician. 2.  Continue current management of symptoms. 3.  Suggest surgical evaluation.  ___________________________ Naaman Plummer FAAPMR Board Certified, American Board of Physical Medicine and Rehabilitation    Nerve Conduction Studies Anti Sensory Summary Table   Stim Site NR Peak (ms) Norm Peak (ms) P-T Amp (V) Norm P-T Amp Site1 Site2 Delta-P (ms) Dist (cm) Vel (m/s) Norm Vel (m/s)  Left Median Acr Palm Anti Sensory (2nd Digit)  31.1C  Wrist *NR  <3.6  >10 Wrist Palm  0.0    Palm *NR  <  2.0          Left Radial Anti Sensory (Base 1st Digit)  30.9C  Wrist    2.3 <3.1 18.6  Wrist Base 1st Digit 2.3 0.0    Left Ulnar Anti Sensory (5th Digit)  31.3C  Wrist    3.6 <3.7 20.8 >15.0 Wrist 5th Digit 3.6 14.0 39 >38   Motor  Summary Table   Stim Site NR Onset (ms) Norm Onset (ms) O-P Amp (mV) Norm O-P Amp Site1 Site2 Delta-0 (ms) Dist (cm) Vel (m/s) Norm Vel (m/s)  Left Median Motor (Abd Poll Brev)  31.1C  Wrist    *8.3 <4.2 *3.6 >5 Elbow Wrist 5.1 21.5 *42 >50  Elbow    13.4  2.1         Left Ulnar Motor (Abd Dig Min)  31.3C  Wrist    3.1 <4.2 6.2 >3 B Elbow Wrist 3.6 20.0 56 >53  B Elbow    6.7  5.8  A Elbow B Elbow 1.3 10.0 77 >53  A Elbow    8.0  5.4          EMG   Side Muscle Nerve Root Ins Act Fibs Psw Amp Dur Poly Recrt Int Dennie Bible Comment  Left Abd Poll Brev Median C8-T1 *CRD *2+ *2+ Nml Nml 0 *Reduced Nml good MUAP  Left 1stDorInt Ulnar C8-T1 Nml Nml Nml Nml Nml 0 Nml Nml   Left PronatorTeres Median C6-7 Nml Nml Nml Nml Nml 0 Nml Nml     Nerve Conduction Studies Anti Sensory Left/Right Comparison   Stim Site L Lat (ms) R Lat (ms) L-R Lat (ms) L Amp (V) R Amp (V) L-R Amp (%) Site1 Site2 L Vel (m/s) R Vel (m/s) L-R Vel (m/s)  Median Acr Palm Anti Sensory (2nd Digit)  31.1C  Wrist       Wrist Palm     Palm             Radial Anti Sensory (Base 1st Digit)  30.9C  Wrist 2.3   18.6   Wrist Base 1st Digit     Ulnar Anti Sensory (5th Digit)  31.3C  Wrist 3.6   20.8   Wrist 5th Digit 39     Motor Left/Right Comparison   Stim Site L Lat (ms) R Lat (ms) L-R Lat (ms) L Amp (mV) R Amp (mV) L-R Amp (%) Site1 Site2 L Vel (m/s) R Vel (m/s) L-R Vel (m/s)  Median Motor (Abd Poll Brev)  31.1C  Wrist *8.3   *3.6   Elbow Wrist *42    Elbow 13.4   2.1         Ulnar Motor (Abd Dig Min)  31.3C  Wrist 3.1   6.2   B Elbow Wrist 56    B Elbow 6.7   5.8   A Elbow B Elbow 77    A Elbow 8.0   5.4            Waveforms:            Clinical History: 05/09/2023 EMG/NCS  Impression: Clinically this appears to be a severe median nerve neuropathy at the wrist or carpal tunnel syndrome.  Electrodiagnostic study performed today.  The above electrodiagnostic study is ABNORMAL and reveals evidence of a severe  right median nerve entrapment at the wrist (carpal tunnel syndrome) affecting sensory and motor components. The lesion is characterized by sensory and motor demyelination with evidence of significant axonal injury.  Rare motor unit action potential  portends guarded prognosis even with decompression.   There is no significant electrodiagnostic evidence of any other focal nerve entrapment, brachial plexopathy or cervical radiculopathy.    Recommendations: 1.  Follow-up with referring physician. 2.  Continue current management of symptoms. 3.  Suggest surgical evaluation.     Objective:  VS:  HT:    WT:   BMI:     BP:   HR: bpm  TEMP: ( )  RESP:  Physical Exam Musculoskeletal:        General: Tenderness present.     Comments: Inspection reveals flattening of the right more than left APB but no atrophy of the bilateral  FDI or hand intrinsics. There is no swelling, color changes, allodynia or dystrophic changes. There is 5 out of 5 strength in the bilateral wrist extension, finger abduction and long finger flexion.  There is decreased sensation to light touch in the right median nerve distribution.  There is a positivve Phalen's test bilaterally. There is a negative Hoffmann's test bilaterally.  Skin:    General: Skin is warm and dry.     Findings: No erythema or rash.  Neurological:     General: No focal deficit present.     Mental Status: He is alert and oriented to person, place, and time.     Cranial Nerves: No cranial nerve deficit.     Sensory: Sensory deficit present.     Motor: No weakness or abnormal muscle tone.     Coordination: Coordination normal.     Gait: Gait normal.  Psychiatric:        Mood and Affect: Mood normal.        Behavior: Behavior normal.        Thought Content: Thought content normal.      Imaging: No results found.

## 2023-06-26 ENCOUNTER — Ambulatory Visit (INDEPENDENT_AMBULATORY_CARE_PROVIDER_SITE_OTHER): Payer: Medicare Other | Admitting: Orthopedic Surgery

## 2023-06-26 DIAGNOSIS — Z01818 Encounter for other preprocedural examination: Secondary | ICD-10-CM

## 2023-06-26 DIAGNOSIS — E1122 Type 2 diabetes mellitus with diabetic chronic kidney disease: Secondary | ICD-10-CM | POA: Diagnosis not present

## 2023-06-26 DIAGNOSIS — G5602 Carpal tunnel syndrome, left upper limb: Secondary | ICD-10-CM | POA: Diagnosis not present

## 2023-06-26 DIAGNOSIS — G5600 Carpal tunnel syndrome, unspecified upper limb: Secondary | ICD-10-CM | POA: Diagnosis not present

## 2023-06-26 DIAGNOSIS — R202 Paresthesia of skin: Secondary | ICD-10-CM

## 2023-06-26 DIAGNOSIS — I10 Essential (primary) hypertension: Secondary | ICD-10-CM | POA: Diagnosis not present

## 2023-06-26 NOTE — Progress Notes (Signed)
 Patient ID: Alexander Clayton, male   DOB: 11-Jun-1927, 89 y.o.   MRN: [id number]     Chief Complaint  Patient presents with   Hand Pain    Patient in today with pain in the hands but the left is the one that hurts bad   Impression: The above electrodiagnostic study is ABNORMAL and reveals evidence of a severe left median nerve entrapment at the wrist (carpal tunnel syndrome) affecting sensory and motor components.  Although electrodiagnostically considered severe the left side is not as severe as the right.   There is no significant electrodiagnostic evidence of any other focal nerve entrapment, brachial plexopathy or cervical radiculopathy.    Recommendations: 1.  Follow-up with referring physician. 2.  Continue current management of symptoms. 3.  Suggest surgical evaluation.   ___________________________ Elease Hashimoto Board Certified, American Board of Physical Medicine and Rehabilitation       Nerve Conduction Studies Anti Sensory Summary Table  I discussed the results of this study with the patient.  This may or may not work.  He says he is willing to try anything because it would make him better than he is.  I reiterated that release may or may not help  He wishes to proceed with carpal tunnel release left wrist and then he wants to have the right one done

## 2023-06-26 NOTE — Patient Instructions (Signed)
 Your surgery will be at [facility] by Dr Romeo Apple  The hospital will contact you with a preoperative appointment to discuss Anesthesia.  Please arrive on time or 15 minutes early for the preoperative appointment, they have a very tight schedule if you are late or do not come in your surgery will be cancelled.  The phone number is [phone]. Please bring your medications with you for the appointment. They will tell you the arrival time and medication instructions when you have your preoperative evaluation. Do not wear nail polish the day of your surgery and if you take Phentermine you need to stop this medication ONE WEEK prior to your surgery. If you take Docia Barrier, Jardiance, or Steglatro) - Hold 72 hours before the procedure.  If you take Ozempic,  Mounjaro, Bydureon or Trulicity do not take for 8 days before your surgery. If you take Victoza, Rybelsis, Saxenda or Adlyxi stop 24 hours before the procedure.  Please arrive at the hospital 2 hours before procedure if scheduled at 9:30 or later in the day or at the time the nurse tells you at your preoperative visit.   If you have my chart do not use the time given in my chart use the time given to you by the nurse during your preoperative visit.   Your surgery  time may change. Please be available for phone calls the day of your surgery and the day before. The Short Stay department may need to discuss changes about your surgery time. Not reaching the you could lead to procedure delays and possible cancellation.  You must have a ride home and someone to stay with you for 24 to 48 hours. The person taking you home will receive and sign for the your discharge instructions.  Please be prepared to give your support person's name and telephone number to [facility]. Dr Romeo Apple will need that name and phone number post procedure.

## 2023-06-26 NOTE — Progress Notes (Signed)
   There were no vitals taken for this visit.  There is no height or weight on file to calculate BMI.  Chief Complaint  Patient presents with   Hand Pain    Patient in today with pain in the hands but the left is the one that hurts bad    Encounter Diagnoses  Name Primary?   Paresthesias in left hand Yes   Carpal tunnel syndrome of left wrist

## 2023-07-06 NOTE — Patient Instructions (Addendum)
 Alexander Clayton  07/06/2023     @PREFPERIOPPHARMACY @   Your procedure is scheduled on  07/11/2023.   Report to [facility] at  0825 A.M.   Call this number if you have problems the morning of surgery:  [phone]  If you experience any cold or flu symptoms such as cough, fever, chills, shortness of breath, etc. between now and your scheduled surgery, please notify us at the above number.   Remember:  Do not eat after midnight.   You may drink clear liquids until 0625 am on 07/11/2023.    Clear liquids allowed are:                    Water, Juice (No red color; non-citric and without pulp; diabetics please choose diet or no sugar options), Carbonated beverages (diabetics please choose diet or no sugar options), Clear Tea (No creamer, milk, or cream, including half & half and powdered creamer), Black Coffee Only (No creamer, milk or cream, including half & half and powdered creamer), and Clear Sports drink (No red color; diabetics please choose diet or no sugar options)    Take these medicines the morning of surgery with A SIP OF WATER                       amlodipine, gabapentin, levothyroxine.    Do not wear jewelry, make-up or nail polish, including gel polish,  artificial nails, or any other type of covering on natural nails (fingers and  toes).  Do not wear lotions, powders, or perfumes, or deodorant.  Do not shave 48 hours prior to surgery.  Men may shave face and neck.  Do not bring valuables to the hospital.  [facility] is not responsible for any belongings or valuables.  Contacts, dentures or bridgework may not be worn into surgery.  Leave your suitcase in the car.  After surgery it may be brought to your room.  For patients admitted to the hospital, discharge time will be determined by your treatment team.  Patients discharged the day of surgery will not be allowed to drive home and must have someone with them for 24 hours.    Special instructions:    DO NOT smoke tobacco or vape for 24 hours before your procedure.  Please read over the following fact sheets that you were given. Coughing and Deep Breathing, Surgical Site Infection Prevention, Anesthesia Post-op Instructions, and Care and Recovery After Surgery       Open Carpal Tunnel Release: What to Expect Open carpal tunnel release is a surgery to relieve symptoms caused by carpal tunnel syndrome (CTS). CTS causes swelling in a narrow space in your wrist. The swelling pinches the median nerve, causing pain, numbness, and weakness in your hand. You may need this surgery if other treatments haven't helped your symptoms. The surgery involves cutting a ligament in your wrist to relieve pressure on the median nerve. Tell a health care provider about: Any allergies you have. All medicines you take. These include vitamins, herbs, eye drops, and creams. Any problems you or family members have had with anesthesia. Any bleeding problems you have. Any surgeries you've had. Any medical problems you have. Whether you're pregnant or may be pregnant. What are the risks? Your health care provider will talk with you about risks. These may include: Infection. Bleeding. Allergic reactions to medicines. Damage to the nerve, a blood vessel, or other nearby structures. Failed  treatment. The surgery fails to treat your symptoms or make your symptoms worse. Long-term weakness of the hand. What happens before? When to stop eating and drinking Eat and drink only as you've been told. You may be told this: 8 hours before your surgery Stop eating most foods. Do not eat meat, fried foods, or fatty foods. Eat only light foods, such as toast or crackers. All liquids are OK except energy drinks and alcohol. 6 hours before your surgery Stop eating. Drink only clear liquids, such as water, clear fruit juice, black coffee, plain tea, and sports drinks. Do not drink energy drinks or alcohol. 2 hours before  your surgery Stop drinking all liquids. You may be allowed to take medicines with small sips of water. If you do not eat and drink as told, your surgery may be delayed or canceled. Medicines Ask about changing or stopping: Any medicines you take. Any vitamins, herbs, or supplements you take. Do not take aspirin or ibuprofen unless you're told to. Surgery safety For your safety, you may: Need to wash your skin with a soap that kills germs. Get antibiotics. Have your surgery site marked. Have hair removed at the surgery site. General instructions Ask if you'll be staying overnight in the hospital. If you'll be going home right after the surgery, plan to have a responsible adult: Drive you home from the hospital or clinic. You won't be allowed to drive. Stay with you for the time you're told. Do not smoke, vape, or use nicotine or tobacco for at least 4 weeks before the surgery. What happens during open carpal tunnel release?  An IV will be put into a vein in your hand or arm. You may be given: A sedative to help you relax. Anesthesia to keep you from feeling pain. A cut will be made in your wrist, on the same side as your palm. The skin of your wrist will be spread to expose the transverse carpal ligament. The ligament will be cut to make more room in the carpal tunnel space. Your cut will be closed with stitches or staples. A compression bandage will be wrapped around your hand and wrist. These steps may vary. Ask what you can expect. What happens after? You'll be watched closely until you leave. This includes checking your pain level, blood pressure, heart rate, and breathing rate. You'll be given pain medicine as needed. A splint or brace may be placed over your bandage. This will hold your hand and wrist in place while you heal. This information is not intended to replace advice given to you by your health care provider. Make sure you discuss any questions you have with your  health care provider. Document Revised: 01/09/2023 Document Reviewed: 01/09/2023 Elsevier Patient Education  2024 Elsevier Inc.General Anesthesia, Adult, Care After The following information offers guidance on how to care for yourself after your procedure. Your health care provider may also give you more specific instructions. If you have problems or questions, contact your health care provider. What can I expect after the procedure? After the procedure, it is common for people to: Have pain or discomfort at the IV site. Have nausea or vomiting. Have a sore throat or hoarseness. Have trouble concentrating. Feel cold or chills. Feel weak, sleepy, or tired (fatigue). Have soreness and body aches. These can affect parts of the body that were not involved in surgery. Follow these instructions at home: For the time period you were told by your health care provider:  Rest. Do not  participate in activities where you could fall or become injured. Do not drive or use machinery. Do not drink alcohol. Do not take sleeping pills or medicines that cause drowsiness. Do not make important decisions or sign legal documents. Do not take care of children on your own. General instructions Drink enough fluid to keep your urine pale yellow. If you have sleep apnea, surgery and certain medicines can increase your risk for breathing problems. Follow instructions from your health care provider about wearing your sleep device: Anytime you are sleeping, including during daytime naps. While taking prescription pain medicines, sleeping medicines, or medicines that make you drowsy. Return to your normal activities as told by your health care provider. Ask your health care provider what activities are safe for you. Take over-the-counter and prescription medicines only as told by your health care provider. Do not use any products that contain nicotine or tobacco. These products include cigarettes, chewing tobacco,  and vaping devices, such as e-cigarettes. These can delay incision healing after surgery. If you need help quitting, ask your health care provider. Contact a health care provider if: You have nausea or vomiting that does not get better with medicine. You vomit every time you eat or drink. You have pain that does not get better with medicine. You cannot urinate or have bloody urine. You develop a skin rash. You have a fever. Get help right away if: You have trouble breathing. You have chest pain. You vomit blood. These symptoms may be an emergency. Get help right away. Call 911. Do not wait to see if the symptoms will go away. Do not drive yourself to the hospital. Summary After the procedure, it is common to have a sore throat, hoarseness, nausea, vomiting, or to feel weak, sleepy, or fatigue. For the time period you were told by your health care provider, do not drive or use machinery. Get help right away if you have difficulty breathing, have chest pain, or vomit blood. These symptoms may be an emergency. This information is not intended to replace advice given to you by your health care provider. Make sure you discuss any questions you have with your health care provider. Document Revised: 07/09/2021 Document Reviewed: 07/09/2021 Elsevier Patient Education  2024 Elsevier Inc.How to Use Chlorhexidine at Home in the Shower Chlorhexidine gluconate (CHG) is a germ-killing (antiseptic) wash that's used to clean the skin. It can get rid of the germs that normally live on the skin and can keep them away for about 24 hours. If you're having surgery, you may be told to shower with CHG at home the night before surgery. This can help lower your risk for infection. To use CHG wash in the shower, follow the steps below. Supplies needed: CHG body wash. Clean washcloth. Clean towel. How to use CHG in the shower Follow these steps unless you're told to use CHG in a different way: Start the  shower. Use your normal soap and shampoo to wash your face and hair. Turn off the shower or move out of the shower stream. Pour CHG onto a clean washcloth. Do not use any type of brush or rough sponge. Start at your neck, washing your body down to your toes. Make sure you: Wash the part of your body where the surgery will be done for at least 1 minute. Do not scrub. Do not use CHG on your head or face unless your health care provider tells you to. If it gets into your ears or eyes, rinse them well  with water. Do not wash your genitals with CHG. Wash your back and under your arms. Make sure to wash skin folds. Let the CHG sit on your skin for 1-2 minutes or as long as told. Rinse your entire body in the shower, including all body creases and folds. Turn off the shower. Dry off with a clean towel. Do not put anything on your skin afterward, such as powder, lotion, or perfume. Put on clean clothes or pajamas. If it's the night before surgery, sleep in clean sheets. General tips Use CHG only as told, and follow the instructions on the label. Use the full amount of CHG as told. This is often one bottle. Do not smoke and stay away from flames after using CHG. Your skin may feel sticky after using CHG. This is normal. The sticky feeling will go away as the CHG dries. Do not use CHG: If you have a chlorhexidine allergy or have reacted to chlorhexidine in the past. On open wounds or areas of skin that have broken skin, cuts, or scrapes. On babies younger than 55 months of age. Contact a health care provider if: You have questions about using CHG. Your skin gets irritated or itchy. You have a rash after using CHG. You swallow any CHG. Call your local poison control center ([phone] in the [place] Your eyes itch badly, or they become very red or swollen. Your hearing changes. You have trouble seeing. If you can't reach your provider, go to an urgent care or emergency room. Do not drive  yourself. Get help right away if: You have swelling or tingling in your mouth or throat. You make high-pitched whistling sounds when you breathe, most often when you breathe out (wheeze). You have trouble breathing. These symptoms may be an emergency. Call 911 right away. Do not wait to see if the symptoms will go away. Do not drive yourself to the hospital. This information is not intended to replace advice given to you by your health care provider. Make sure you discuss any questions you have with your health care provider. Document Revised: 10/25/2022 Document Reviewed: 10/21/2021 Elsevier Patient Education  2024 [employer].

## 2023-07-07 ENCOUNTER — Encounter (HOSPITAL_COMMUNITY): Payer: Self-pay

## 2023-07-07 ENCOUNTER — Encounter (HOSPITAL_COMMUNITY)
Admission: RE | Admit: 2023-07-07 | Discharge: 2023-07-07 | Disposition: A | Discharge status: Home / Self Care | Source: Ambulatory Visit | Attending: Orthopedic Surgery | Admitting: Orthopedic Surgery

## 2023-07-07 ENCOUNTER — Other Ambulatory Visit: Payer: Self-pay

## 2023-07-07 VITALS — BP 120/55 | HR 83 | Temp 98.0°F | Resp 18 | Ht 67.0 in | Wt 200.0 lb

## 2023-07-07 DIAGNOSIS — G5602 Carpal tunnel syndrome, left upper limb: Secondary | ICD-10-CM | POA: Diagnosis not present

## 2023-07-07 DIAGNOSIS — R9431 Abnormal electrocardiogram [ECG] [EKG]: Secondary | ICD-10-CM | POA: Diagnosis not present

## 2023-07-07 DIAGNOSIS — R202 Paresthesia of skin: Secondary | ICD-10-CM | POA: Diagnosis not present

## 2023-07-07 DIAGNOSIS — E119 Type 2 diabetes mellitus without complications: Secondary | ICD-10-CM | POA: Diagnosis not present

## 2023-07-07 DIAGNOSIS — Z01818 Encounter for other preprocedural examination: Secondary | ICD-10-CM | POA: Insufficient documentation

## 2023-07-07 DIAGNOSIS — Z01812 Encounter for preprocedural laboratory examination: Secondary | ICD-10-CM | POA: Diagnosis not present

## 2023-07-07 DIAGNOSIS — Z01810 Encounter for preprocedural cardiovascular examination: Secondary | ICD-10-CM | POA: Diagnosis not present

## 2023-07-07 DIAGNOSIS — I10 Essential (primary) hypertension: Secondary | ICD-10-CM | POA: Insufficient documentation

## 2023-07-07 LAB — CBC WITH DIFFERENTIAL/PLATELET
Abs Immature Granulocytes: 0.04 10*3/uL (ref 0.00–0.07)
Basophils Absolute: 0.1 10*3/uL (ref 0.0–0.1)
Basophils Relative: 1 %
Eosinophils Absolute: 0.4 10*3/uL (ref 0.0–0.5)
Eosinophils Relative: 3 %
HCT: 39.2 % (ref 39.0–52.0)
Hemoglobin: 13.4 g/dL (ref 13.0–17.0)
Immature Granulocytes: 0 %
Lymphocytes Relative: 24 %
Lymphs Abs: 2.7 10*3/uL (ref 0.7–4.0)
MCH: 31.2 pg (ref 26.0–34.0)
MCHC: 34.2 g/dL (ref 30.0–36.0)
MCV: 91.4 fL (ref 80.0–100.0)
Monocytes Absolute: 1.1 10*3/uL — ABNORMAL HIGH (ref 0.1–1.0)
Monocytes Relative: 10 %
Neutro Abs: 7.2 10*3/uL (ref 1.7–7.7)
Neutrophils Relative %: 62 %
Platelets: 331 10*3/uL (ref 150–400)
RBC: 4.29 MIL/uL (ref 4.22–5.81)
RDW: 12.9 % (ref 11.5–15.5)
WBC: 11.5 10*3/uL — ABNORMAL HIGH (ref 4.0–10.5)
nRBC: 0 % (ref 0.0–0.2)

## 2023-07-07 LAB — BASIC METABOLIC PANEL
Anion gap: 12 (ref 5–15)
BUN: 24 mg/dL — ABNORMAL HIGH (ref 8–23)
CO2: 24 mmol/L (ref 22–32)
Calcium: 8.9 mg/dL (ref 8.9–10.3)
Chloride: 102 mmol/L (ref 98–111)
Creatinine, Ser: 1.13 mg/dL (ref 0.61–1.24)
GFR, Estimated: >60 mL/min (ref >60)
Glucose, Bld: 186 mg/dL — ABNORMAL HIGH (ref 70–99)
Potassium: 3.9 mmol/L (ref 3.5–5.1)
Sodium: 138 mmol/L (ref 135–145)

## 2023-07-07 NOTE — Pre-Procedure Instructions (Signed)
 PAT completed. Incentive spirometry given to patient during PAT and went over with patient. Instructions sheet also given. Patient was able to reach goal 1500 x3.

## 2023-07-08 LAB — HEMOGLOBIN A1C
Hgb A1c MFr Bld: 6.6 % — ABNORMAL HIGH (ref 4.8–5.6)
Mean Plasma Glucose: 142.72 mg/dL

## 2023-07-10 NOTE — H&P (Addendum)
 Alexander Clayton is an 89 y.o. male.   Chief Complaint: Pain and paresthesias left upper extremity HPI: 89 year old male disabling carpal tunnel syndrome both wrists positive nerve conduction studies presents for left carpal tunnel release  Past Medical History:  Diagnosis Date   Bladder cancer (HCC)    CAD (coronary artery disease)    a. per coronary CTA 09/2018 - managed medically.   CKD (chronic kidney disease), stage III (HCC)    Diabetes mellitus without complication (HCC)    Hepatic steatosis    by CT 08/2018   Hiatal hernia    small by CT 08/2018   Hypercholesteremia    Hypertension     Past Surgical History:  Procedure Laterality Date   APPENDECTOMY     HERNIA REPAIR      Family History  Problem Relation Age of Onset   Hypertension Father    Cirrhosis Brother    Diabetes Mellitus II Brother    Social History:  reports that he has never smoked. He has never used smokeless tobacco. He reports that he does not drink alcohol and does not use drugs.  Allergies:  Allergies  Allergen Reactions   Ciprofloxacin Hives and Rash   Vioxx [Rofecoxib] Hives and Rash    sweating    No medications prior to admission.    No results found for this or any previous visit (from the past 48 hours). No results found.  Review of Systems  Weight loss none  Eye pain none  Headache none  Chest pain none  Shortness of breath on occasion  Diarrhea none  Frequency occasional  Rash none itching none  Psychiatric no anxiety  No easy bleeding  No excessive thirst and normal reactions to foods no environmental allergy  There were no vitals taken for this visit. Physical Exam   General Appearance: Normal  Orientation normal to person place and time  Mood pleasant and affect normal  Gait slow labored  Left upper extremity inspection no gross atrophy, no tenderness over the carpal tunnel.  Decreased range of motion in the interphalangeal joints normal range of  motion of the wrist wrist joint stable grip strength equal to the opposite side skin intact pulse and perfusion normal color capillary refill normal sensation loss in the thumb index long and part of the ring finger reflexes intact coordination in the hand poor  Assessment/Plan Left carpal tunnel syndrome  Open left carpal tunnel release  Fuller Canada, MD 07/10/2023, 12:53 PM

## 2023-07-11 ENCOUNTER — Ambulatory Visit (HOSPITAL_COMMUNITY)
Admission: RE | Admit: 2023-07-11 | Discharge: 2023-07-11 | Disposition: A | Discharge status: Home / Self Care | Attending: Orthopedic Surgery | Admitting: Orthopedic Surgery

## 2023-07-11 ENCOUNTER — Other Ambulatory Visit: Payer: Self-pay

## 2023-07-11 ENCOUNTER — Ambulatory Visit (HOSPITAL_BASED_OUTPATIENT_CLINIC_OR_DEPARTMENT_OTHER): Admitting: Anesthesiology

## 2023-07-11 ENCOUNTER — Ambulatory Visit (HOSPITAL_COMMUNITY): Admitting: Anesthesiology

## 2023-07-11 ENCOUNTER — Encounter (HOSPITAL_COMMUNITY): Payer: Self-pay | Admitting: Orthopedic Surgery

## 2023-07-11 ENCOUNTER — Encounter (HOSPITAL_COMMUNITY)
Admission: RE | Disposition: A | Discharge status: Home / Self Care | Payer: Self-pay | Source: Home / Self Care | Attending: Orthopedic Surgery

## 2023-07-11 DIAGNOSIS — I129 Hypertensive chronic kidney disease with stage 1 through stage 4 chronic kidney disease, or unspecified chronic kidney disease: Secondary | ICD-10-CM | POA: Insufficient documentation

## 2023-07-11 DIAGNOSIS — G5602 Carpal tunnel syndrome, left upper limb: Secondary | ICD-10-CM | POA: Diagnosis not present

## 2023-07-11 DIAGNOSIS — K449 Diaphragmatic hernia without obstruction or gangrene: Secondary | ICD-10-CM | POA: Diagnosis not present

## 2023-07-11 DIAGNOSIS — N183 Chronic kidney disease, stage 3 unspecified: Secondary | ICD-10-CM | POA: Diagnosis not present

## 2023-07-11 DIAGNOSIS — Z833 Family history of diabetes mellitus: Secondary | ICD-10-CM | POA: Diagnosis not present

## 2023-07-11 DIAGNOSIS — I2510 Atherosclerotic heart disease of native coronary artery without angina pectoris: Secondary | ICD-10-CM | POA: Diagnosis not present

## 2023-07-11 DIAGNOSIS — K760 Fatty (change of) liver, not elsewhere classified: Secondary | ICD-10-CM | POA: Insufficient documentation

## 2023-07-11 DIAGNOSIS — M1990 Unspecified osteoarthritis, unspecified site: Secondary | ICD-10-CM | POA: Insufficient documentation

## 2023-07-11 DIAGNOSIS — E039 Hypothyroidism, unspecified: Secondary | ICD-10-CM | POA: Diagnosis not present

## 2023-07-11 DIAGNOSIS — Z8551 Personal history of malignant neoplasm of bladder: Secondary | ICD-10-CM | POA: Insufficient documentation

## 2023-07-11 DIAGNOSIS — E1122 Type 2 diabetes mellitus with diabetic chronic kidney disease: Secondary | ICD-10-CM | POA: Insufficient documentation

## 2023-07-11 HISTORY — PX: CARPAL TUNNEL RELEASE: SHX101

## 2023-07-11 LAB — GLUCOSE, CAPILLARY
Glucose-Capillary: 102 mg/dL — ABNORMAL HIGH (ref 70–99)
Glucose-Capillary: 88 mg/dL (ref 70–99)

## 2023-07-11 SURGERY — CARPAL TUNNEL RELEASE
Anesthesia: General | Site: Hand | Laterality: Left

## 2023-07-11 MED ORDER — CHLORHEXIDINE GLUCONATE 0.12 % MT SOLN
15.0000 mL | Freq: Once | OROMUCOSAL | Status: AC
  Administered 2023-07-11: 15 mL via OROMUCOSAL

## 2023-07-11 MED ORDER — FENTANYL CITRATE (PF) 100 MCG/2ML IJ SOLN
INTRAMUSCULAR | Status: DC | PRN
  Administered 2023-07-11: 25 ug via INTRAVENOUS
  Administered 2023-07-11: 50 ug via INTRAVENOUS
  Administered 2023-07-11: 25 ug via INTRAVENOUS

## 2023-07-11 MED ORDER — ONDANSETRON HCL 4 MG/2ML IJ SOLN
INTRAMUSCULAR | Status: DC | PRN
  Administered 2023-07-11: 4 mg via INTRAVENOUS

## 2023-07-11 MED ORDER — PROPOFOL 500 MG/50ML IV EMUL
INTRAVENOUS | Status: DC | PRN
  Administered 2023-07-11: 50 ug/kg/min via INTRAVENOUS

## 2023-07-11 MED ORDER — PROPOFOL 10 MG/ML IV BOLUS
INTRAVENOUS | Status: DC | PRN
  Administered 2023-07-11 (×2): 30 mg via INTRAVENOUS
  Administered 2023-07-11: 40 mg via INTRAVENOUS

## 2023-07-11 MED ORDER — LIDOCAINE HCL (PF) 2 % IJ SOLN
INTRAMUSCULAR | Status: DC | PRN
  Administered 2023-07-11: 50 mg via INTRADERMAL

## 2023-07-11 MED ORDER — ORAL CARE MOUTH RINSE: 15.0000 mL | Freq: Once | OROMUCOSAL | Status: AC

## 2023-07-11 MED ORDER — CEFAZOLIN SODIUM-DEXTROSE 2-4 GM/100ML-% IV SOLN
2.0000 g | INTRAVENOUS | Status: AC
  Administered 2023-07-11: 2 g via INTRAVENOUS

## 2023-07-11 MED ORDER — FENTANYL CITRATE (PF) 100 MCG/2ML IJ SOLN
INTRAMUSCULAR | Status: AC
  Filled 2023-07-11: qty 2

## 2023-07-11 MED ORDER — TRAMADOL HCL 50 MG PO TABS: 50.0000 mg | ORAL_TABLET | Freq: Four times a day (QID) | ORAL | 0 refills | Status: AC | PRN

## 2023-07-11 MED ORDER — PROPOFOL 10 MG/ML IV BOLUS
INTRAVENOUS | Status: AC
  Filled 2023-07-11: qty 20

## 2023-07-11 MED ORDER — LACTATED RINGERS IV SOLN: INTRAVENOUS | Status: DC

## 2023-07-11 MED ORDER — LIDOCAINE HCL (PF) 2 % IJ SOLN
INTRAMUSCULAR | Status: AC
  Filled 2023-07-11: qty 5

## 2023-07-11 MED ORDER — LIDOCAINE HCL (PF) 1 % IJ SOLN
INTRAMUSCULAR | Status: DC | PRN
  Administered 2023-07-11: 6 mL

## 2023-07-11 MED ORDER — CEFAZOLIN SODIUM-DEXTROSE 2-4 GM/100ML-% IV SOLN
INTRAVENOUS | Status: AC
  Filled 2023-07-11: qty 100

## 2023-07-11 MED ORDER — 0.9 % SODIUM CHLORIDE (POUR BTL) OPTIME
TOPICAL | Status: DC | PRN
  Administered 2023-07-11: 1000 mL

## 2023-07-11 MED ORDER — PROPOFOL 500 MG/50ML IV EMUL
INTRAVENOUS | Status: AC
  Filled 2023-07-11: qty 50

## 2023-07-11 MED ORDER — ONDANSETRON HCL 4 MG/2ML IJ SOLN
INTRAMUSCULAR | Status: AC
  Filled 2023-07-11: qty 2

## 2023-07-11 MED ORDER — BUPIVACAINE HCL (PF) 0.5 % IJ SOLN
INTRAMUSCULAR | Status: AC
  Filled 2023-07-11: qty 30

## 2023-07-11 MED ORDER — LIDOCAINE HCL (PF) 1 % IJ SOLN
INTRAMUSCULAR | Status: AC
  Filled 2023-07-11: qty 30

## 2023-07-11 MED ORDER — PHENYLEPHRINE 80 MCG/ML (10ML) SYRINGE FOR IV PUSH (FOR BLOOD PRESSURE SUPPORT)
PREFILLED_SYRINGE | INTRAVENOUS | Status: AC
  Filled 2023-07-11: qty 10

## 2023-07-11 MED ORDER — BUPIVACAINE HCL (PF) 0.5 % IJ SOLN
INTRAMUSCULAR | Status: DC | PRN
  Administered 2023-07-11: 3 mL
  Administered 2023-07-11: 7 mL

## 2023-07-11 SURGICAL SUPPLY — 31 items
BANDAGE ESMARK 4X12 BL STRL LF (DISPOSABLE) ×1 IMPLANT
BLADE SURG 15 STRL LF DISP TIS (BLADE) ×1 IMPLANT
BNDG ELASTIC 3X5.8 VLCR NS LF (GAUZE/BANDAGES/DRESSINGS) ×1 IMPLANT
BNDG ESMARK 4X12 BLUE STRL LF (DISPOSABLE) ×1 IMPLANT
BNDG GAUZE DERMACEA FLUFF 4 (GAUZE/BANDAGES/DRESSINGS) IMPLANT
BNDG GAUZE ELAST 4 BULKY (GAUZE/BANDAGES/DRESSINGS) ×1 IMPLANT
CHLORAPREP W/TINT 26 (MISCELLANEOUS) ×1 IMPLANT
CLOTH BEACON ORANGE TIMEOUT ST (SAFETY) ×1 IMPLANT
COVER LIGHT HANDLE STERIS (MISCELLANEOUS) ×2 IMPLANT
CUFF TOURN SGL QUICK 18X4 (TOURNIQUET CUFF) ×1 IMPLANT
ELECT REM PT RETURN 9FT ADLT (ELECTROSURGICAL) ×1 IMPLANT
ELECTRODE REM PT RTRN 9FT ADLT (ELECTROSURGICAL) ×1 IMPLANT
GAUZE SPONGE 4X4 12PLY STRL (GAUZE/BANDAGES/DRESSINGS) ×1 IMPLANT
GAUZE XEROFORM 1X8 LF (GAUZE/BANDAGES/DRESSINGS) ×1 IMPLANT
GLOVE BIOGEL PI IND STRL 7.0 (GLOVE) ×2 IMPLANT
GLOVE BIOGEL PI IND STRL 8.5 (GLOVE) ×1 IMPLANT
GLOVE SKINSENSE STRL SZ8.0 LF (GLOVE) ×1 IMPLANT
GOWN STRL REUS W/TWL LRG LVL3 (GOWN DISPOSABLE) ×1 IMPLANT
GOWN STRL REUS W/TWL XL LVL3 (GOWN DISPOSABLE) ×1 IMPLANT
KIT TURNOVER KIT A (KITS) ×1 IMPLANT
MANIFOLD NEPTUNE II (INSTRUMENTS) ×1 IMPLANT
NDL HYPO 21X1.5 SAFETY (NEEDLE) ×1 IMPLANT
NEEDLE HYPO 21X1.5 SAFETY (NEEDLE) ×1 IMPLANT
NS IRRIG 1000ML POUR BTL (IV SOLUTION) ×1 IMPLANT
PACK BASIC LIMB (CUSTOM PROCEDURE TRAY) ×1 IMPLANT
PAD ARMBOARD POSITIONER FOAM (MISCELLANEOUS) ×1 IMPLANT
POSITIONER HAND ALUMI XLG (MISCELLANEOUS) ×1 IMPLANT
POSITIONER HEAD 8X9X4 ADT (SOFTGOODS) ×1 IMPLANT
SET BASIN LINEN APH (SET/KITS/TRAYS/PACK) ×1 IMPLANT
SUT 3-0 BLK 1X30 PSL (SUTURE) ×1 IMPLANT
SYR CONTROL 10ML LL (SYRINGE) ×1 IMPLANT

## 2023-07-11 NOTE — Brief Op Note (Signed)
 07/11/2023  11:29 AM  PATIENT:  Morton Stall  89 y.o. male  PRE-OPERATIVE DIAGNOSIS:  left carpal tunnel syndrome  POST-OPERATIVE DIAGNOSIS:  left carpal tunnel syndrome  PROCEDURE:  Procedure(s): CARPAL TUNNEL RELEASE (Left)  SURGEON:  Surgeons and Role:    Vickki Hearing, MD - Primary  PHYSICIAN ASSISTANT:   ASSISTANTS: none   ANESTHESIA:    + local   EBL:min   BLOOD ADMINISTERED:none  DRAINS: none   LOCAL MEDICATIONS USED:  MARCAINE    and LIDOCAINE   SPECIMEN:  No Specimen  DISPOSITION OF SPECIMEN:  N/A  COUNTS:  YES  TOURNIQUET:   Total Tourniquet Time Documented: Upper Arm (Left) - 22 minutes Total: Upper Arm (Left) - 22 minutes   DICTATION: .Reubin Milan Dictation  PLAN OF CARE: Discharge to home after PACU  PATIENT DISPOSITION:  PACU - hemodynamically stable.   Delay start of Pharmacological VTE agent (>24hrs) due to surgical blood loss or risk of bleeding: not applicable

## 2023-07-11 NOTE — Anesthesia Procedure Notes (Signed)
 Date/Time: 07/11/2023 10:40 AM  Performed by: Julian Reil, CRNAPre-anesthesia Checklist: Patient identified, Emergency Drugs available, Suction available and Patient being monitored Patient Re-evaluated:Patient Re-evaluated prior to induction Oxygen Delivery Method: Simple face mask Induction Type: IV induction Placement Confirmation: positive ETCO2

## 2023-07-11 NOTE — Transfer of Care (Signed)
 Immediate Anesthesia Transfer of Care Note  Patient: Alexander Clayton  Procedure(s) Performed: CARPAL TUNNEL RELEASE (Left: Hand)  Patient Location: PACU  Anesthesia Type:General  Level of Consciousness: awake, alert , and oriented  Airway & Oxygen Therapy: Patient Spontanous Breathing  Post-op Assessment: Report given to RN and Post -op Vital signs reviewed and stable  Post vital signs: Reviewed and stable  Last Vitals:  Vitals Value Taken Time  BP 158/71 07/11/23 1128  Temp 97.5   Pulse 71 07/11/23 1130  Resp 11 07/11/23 1130  SpO2 92 % 07/11/23 1130  Vitals shown include unfiled device data.  Last Pain:  Vitals:   07/11/23 0854  TempSrc: Oral  PainSc: 0-No pain         Complications: No notable events documented.

## 2023-07-11 NOTE — Discharge Instructions (Signed)

## 2023-07-11 NOTE — Anesthesia Postprocedure Evaluation (Signed)
 Anesthesia Post Note  Patient: Alexander Clayton  Procedure(s) Performed: CARPAL TUNNEL RELEASE (Left: Hand)  Patient location during evaluation: PACU Anesthesia Type: General Level of consciousness: awake and alert Pain management: pain level controlled Vital Signs Assessment: post-procedure vital signs reviewed and stable Respiratory status: spontaneous breathing, nonlabored ventilation, respiratory function stable and patient connected to nasal cannula oxygen Cardiovascular status: blood pressure returned to baseline and stable Postop Assessment: no apparent nausea or vomiting Anesthetic complications: no   There were no known notable events for this encounter.   Last Vitals:  Vitals:   07/11/23 1155 07/11/23 1204  BP:  (!) 153/70  Pulse: 75   Resp: 14   Temp: (!) 36.3 C   SpO2: 96%     Last Pain:  Vitals:   07/11/23 1155  TempSrc: Oral  PainSc:                  Gaetano Hawthorne

## 2023-07-11 NOTE — Anesthesia Preprocedure Evaluation (Signed)
 Anesthesia Evaluation  Patient identified by MRN, date of birth, ID band Patient awake    Reviewed: Allergy & Precautions, H&P , NPO status , Patient's Chart, lab work & pertinent test results, reviewed documented beta blocker date and time   Airway Mallampati: II  TM Distance: >3 FB Neck ROM: full    Dental no notable dental hx. (+) Dental Advisory Given, Teeth Intact   Pulmonary neg pulmonary ROS   Pulmonary exam normal breath sounds clear to auscultation       Cardiovascular Exercise Tolerance: Good hypertension, + CAD  Normal cardiovascular exam Rhythm:regular Rate:Normal     Neuro/Psych  Neuromuscular disease  negative psych ROS   GI/Hepatic hiatal hernia,,,Hepatic steatosis   Endo/Other  diabetes, Type 2Hypothyroidism    Renal/GU Renal diseaseStage 3 CKD  negative genitourinary   Musculoskeletal  (+) Arthritis , Osteoarthritis,    Abdominal   Peds  Hematology negative hematology ROS (+)   Anesthesia Other Findings Bladder cancer  Reproductive/Obstetrics negative OB ROS                             Anesthesia Physical Anesthesia Plan  ASA: 3  Anesthesia Plan: General   Post-op Pain Management: Minimal or no pain anticipated   Induction: Intravenous  PONV Risk Score and Plan: Propofol infusion  Airway Management Planned: Nasal Cannula and Natural Airway  Additional Equipment: None  Intra-op Plan:   Post-operative Plan:   Informed Consent: I have reviewed the patients History and Physical, chart, labs and discussed the procedure including the risks, benefits and alternatives for the proposed anesthesia with the patient or authorized representative who has indicated his/her understanding and acceptance.     Dental Advisory Given  Plan Discussed with: CRNA  Anesthesia Plan Comments:         Anesthesia Quick Evaluation

## 2023-07-11 NOTE — Interval H&P Note (Signed)
 History and Physical Interval Note:  07/11/2023 10:33 AM  Alexander Clayton  has presented today for surgery, with the diagnosis of left carpal tunnel syndrome.  The various methods of treatment have been discussed with the patient and family. After consideration of risks, benefits and other options for treatment, the patient has consented to  Procedure(s): CARPAL TUNNEL RELEASE (Left) as a surgical intervention.  The patient's history has been reviewed, patient examined, no change in status, stable for surgery.  I have reviewed the patient's chart and labs.  Questions were answered to the patient's satisfaction.     Fuller Canada

## 2023-07-11 NOTE — Progress Notes (Signed)
 Patient has Incentive spirometry at home. Patient verbalizes understanding on how to use this. Patient has no questions at this time.

## 2023-07-11 NOTE — Op Note (Signed)
 Date 07/11/2023   PRE-OPERATIVE DIAGNOSIS:  LEFT CARPAL TUNNEL SYNDROME  POST-OPERATIVE DIAGNOSIS:  LEFT CARPAL TUNNEL SYNDROME  PROCEDURE:  Procedure(s): LEFT CARPAL TUNNEL RELEASE left  FINDINGS: Median nerve compression, discoloration, apple core shape no synovitis  SURGEON:  Surgeon(s) and Role:    * Vickki Hearing, MD - Primary  PHYSICIAN ASSISTANT:   ASSISTANTS: none   ANESTHESIA:   MAC plus local  BLOOD ADMINISTERED:none  DRAINS: none   LOCAL MEDICATIONS USED:  MARCAINE   lidocaine  SPECIMEN:  No Specimen  DISPOSITION OF SPECIMEN:  N/A  COUNTS:  YES  TOURNIQUET: 250 mmHg  DICTATION: .Dragon Dictation   Carpal tunnel release left wrist   Surgeon Romeo Apple  Indications failure of conservative treatment to relieve pain and paresthesias and numbness and tingling of the left hand  The patient was identified in the preop area we confirm the surgical site marked as left wrist chart update completed. Patient taken to surgery.  Antibiotic Ancef 2 g after establishing a successful anesthesia via MAC technique timeout was performed  The   left arm was prepped and draped sterilely  Timeout executed completed and confirmed site.  The tourniquet was elevated after exsanguination of the limb with a 4 inch Esmarch followed by injection of the area with 1% plain lidocaine left wrist  A straight incision was made over the left carpal tunnel in line with the radial border of the ring finger. Blunt dissection was carried out to find the distal aspect of the carpal tunnel. A blunted instrument was passed beneath the carpal tunnel. Sharp incision was then used to release the transverse carpal ligament. The contents of the carpal tunnel were inspected. The median nerve was severely flattened with an apple core appearance it was discolored and compressed  The wound was irrigated and then closed with 3-0 nylon suture. We injected 10 mL of plain Marcaine on the radial side of the  incision  A sterile bandage was applied and the tourniquet was released the color of the hand and capillary refill were normal  The patient was taken to the recovery room in stable condition  PLAN OF CARE: Discharge to home after PACU  PATIENT DISPOSITION:  PACU - hemodynamically stable.   Delay start of Pharmacological VTE agent (>24hrs) due to surgical blood loss or risk of bleeding: not applicable

## 2023-07-12 ENCOUNTER — Encounter (HOSPITAL_COMMUNITY): Payer: Self-pay | Admitting: Orthopedic Surgery

## 2023-07-17 DIAGNOSIS — Z85828 Personal history of other malignant neoplasm of skin: Secondary | ICD-10-CM | POA: Diagnosis not present

## 2023-07-17 DIAGNOSIS — Z1283 Encounter for screening for malignant neoplasm of skin: Secondary | ICD-10-CM | POA: Diagnosis not present

## 2023-07-17 DIAGNOSIS — D485 Neoplasm of uncertain behavior of skin: Secondary | ICD-10-CM | POA: Diagnosis not present

## 2023-07-17 DIAGNOSIS — C44319 Basal cell carcinoma of skin of other parts of face: Secondary | ICD-10-CM | POA: Diagnosis not present

## 2023-07-17 DIAGNOSIS — Z08 Encounter for follow-up examination after completed treatment for malignant neoplasm: Secondary | ICD-10-CM | POA: Diagnosis not present

## 2023-07-17 DIAGNOSIS — D2271 Melanocytic nevi of right lower limb, including hip: Secondary | ICD-10-CM | POA: Diagnosis not present

## 2023-07-17 DIAGNOSIS — D225 Melanocytic nevi of trunk: Secondary | ICD-10-CM | POA: Diagnosis not present

## 2023-07-24 NOTE — Progress Notes (Unsigned)
 Cardiology Office Note    Date:  07/25/2023  ID:  Alexander Clayton, DOB Jan 16, 1928, MRN [id number] Cardiologist: Dietrich Pates, MD    History of Present Illness:    Alexander Clayton is a 89 y.o. male with past medical history of CAD (s/p Coronary CT in 09/2018 with diffuse CAD and FFR only positive in very distal branches and in the mid portion of a non-dominant LCx artery with medical management recommended), HTN, HLD, Type 2 DM, Stage 3 CKD and bladder cancer (s/p immunotherapy) who presents to the office today for annual follow-up.   He was examined by myself in 06/2022 and was overall doing well. Continued medical management of his CAD was recommended given his age and no recent anginal symptoms. Was continued on ASA 81mg  daily, Atorvastatin 80mg  daily, Amlodipine 10mg  daily, Lozol 2.5mg  daily and Losartan 100mg  daily.   In talking with the patient today, he reports overall doing well since his last office visit. He is the primary caregiver for his wife as well who is 74 years old. He still performs most of the chores around the home and does the grocery shopping. They mostly eat at home and he does try to limit frozen food but they do have fried chicken on occasion.  He denies any recent chest pain or dyspnea on exertion. No recent palpitations, orthopnea, PND or pitting edema.  Studies Reviewed:   EKG: EKG is not ordered today. EKG from 07/07/2023 is reviewed and shows NSR, HR 86 with PVC's and hypertrophy with repol abnormalities.   Echocardiogram: 11/2019 IMPRESSIONS     1. Left ventricular ejection fraction, by estimation, is 55 to 60%. The  left ventricle has normal function. The left ventricle has no regional  wall motion abnormalities. There is mild left ventricular hypertrophy.  Left ventricular diastolic parameters  are consistent with Grade I diastolic dysfunction (impaired relaxation).   2. Right ventricular systolic function is normal. The right ventricular  size is  normal.   3. The mitral valve is normal in structure. No evidence of mitral valve  regurgitation. No evidence of mitral stenosis.   4. The aortic valve was not well visualized. Aortic valve regurgitation  is not visualized. No aortic stenosis is present.   5. The inferior vena cava is normal in size with greater than 50%  respiratory variability, suggesting right atrial pressure of 3 mmHg.    Physical Exam:   VS:  BP 130/62   Pulse 76   Ht 5' 7.5" (1.715 m)   Wt 195 lb 12.8 oz (88.8 kg)   SpO2 96%   BMI 30.21 kg/m    Wt Readings from Last 3 Encounters:  07/25/23 195 lb 12.8 oz (88.8 kg)  07/11/23 199 lb 15.3 oz (90.7 kg)  07/07/23 199 lb 15.3 oz (90.7 kg)     GEN: Pleasant, elderly male appearing in no acute distress NECK: No JVD; No carotid bruits CARDIAC: RRR, no murmurs, rubs, gallops RESPIRATORY:  Clear to auscultation without rales, wheezing or rhonchi  ABDOMEN: Appears non-distended. No obvious abdominal masses. EXTREMITIES: No clubbing or cyanosis. No pitting edema.  Distal pedal pulses are 2+ bilaterally.   Assessment and Plan:   1. CAD/HLD - Prior Coronary CT in 2020 showed diffuse CAD but FFR was only positive along the distal branches and a midportion of a non-dominant LCx and medical management was recommended. Given his age, he overall remains very active and denies any recent anginal symptoms. Will continue with medication management. - Continue  ASA 81 mg daily and Atorvastatin 20 mg daily. His LDL was 59 when checked in 02/2023.  2. HTN - Blood pressure is well-controlled at 130/62 during today's visit. Continue current medical therapy with Amlodipine 10 mg daily, Lozol 2.5 mg daily and Losartan 100 mg daily.  3. Stage 3 CKD - Creatinine was at stable at 1.13 when checked last month which is close to his known baseline.  Signed, Ellsworth Lennox, PA-C

## 2023-07-25 ENCOUNTER — Ambulatory Visit: Payer: Medicare Other | Attending: Student | Admitting: Student

## 2023-07-25 ENCOUNTER — Encounter: Payer: Self-pay | Admitting: Student

## 2023-07-25 VITALS — BP 130/62 | HR 76 | Ht 67.5 in | Wt 195.8 lb

## 2023-07-25 DIAGNOSIS — I2510 Atherosclerotic heart disease of native coronary artery without angina pectoris: Secondary | ICD-10-CM | POA: Diagnosis not present

## 2023-07-25 DIAGNOSIS — I10 Essential (primary) hypertension: Secondary | ICD-10-CM | POA: Diagnosis not present

## 2023-07-25 DIAGNOSIS — N1831 Chronic kidney disease, stage 3a: Secondary | ICD-10-CM | POA: Diagnosis not present

## 2023-07-25 DIAGNOSIS — E785 Hyperlipidemia, unspecified: Secondary | ICD-10-CM

## 2023-07-25 NOTE — Patient Instructions (Signed)
 Medication Instructions:  Your physician recommends that you continue on your current medications as directed. Please refer to the Current Medication list given to you today.  *If you need a refill on your cardiac medications before your next appointment, please call your pharmacy*  Lab Work: NONE   If you have labs (blood work) drawn today and your tests are completely normal, you will receive your results only by: MyChart Message (if you have MyChart) OR A paper copy in the mail If you have any lab test that is abnormal or we need to change your treatment, we will call you to review the results.  Testing/Procedures: NONE   Follow-Up: At [facility], you and your health needs are our priority.  As part of our continuing mission to provide you with exceptional heart care, our providers are all part of one team.  This team includes your primary Cardiologist (physician) and Advanced Practice Providers or APPs (Physician Assistants and Nurse Practitioners) who all work together to provide you with the care you need, when you need it.  Your next appointment:   1 year(s)  Provider:   Dietrich Pates, MD or Randall An, PA-C    We recommend signing up for the patient portal called "MyChart".  Sign up information is provided on this After Visit Summary.  MyChart is used to connect with patients for Virtual Visits (Telemedicine).  Patients are able to view lab/test results, encounter notes, upcoming appointments, etc.  Non-urgent messages can be sent to your provider as well.   To learn more about what you can do with MyChart, go to [email or web address].   Other Instructions Thank you for choosing [facility]!

## 2023-07-26 DIAGNOSIS — K080 Exfoliation of teeth due to systemic causes: Secondary | ICD-10-CM | POA: Diagnosis not present

## 2023-07-26 NOTE — Progress Notes (Unsigned)
   There were no vitals taken for this visit.  There is no height or weight on file to calculate BMI.  No chief complaint on file.   Encounter Diagnosis  Name Primary?   Carpal tunnel syndrome of left wrist s/p release 07/11/23 Yes    DOI/DOS/ Date: 07/11/2023  {CHL AMB ORT SYMPTOMS POST TREATMENT:21798}

## 2023-07-27 ENCOUNTER — Ambulatory Visit (INDEPENDENT_AMBULATORY_CARE_PROVIDER_SITE_OTHER): Admitting: Orthopedic Surgery

## 2023-07-27 DIAGNOSIS — G5602 Carpal tunnel syndrome, left upper limb: Secondary | ICD-10-CM

## 2023-07-27 DIAGNOSIS — M25562 Pain in left knee: Secondary | ICD-10-CM

## 2023-07-27 DIAGNOSIS — M1712 Unilateral primary osteoarthritis, left knee: Secondary | ICD-10-CM

## 2023-07-27 DIAGNOSIS — G8929 Other chronic pain: Secondary | ICD-10-CM | POA: Diagnosis not present

## 2023-07-27 MED ORDER — METHYLPREDNISOLONE ACETATE 40 MG/ML IJ SUSP
40.0000 mg | Freq: Once | INTRAMUSCULAR | Status: AC
  Administered 2023-07-27: 40 mg via INTRA_ARTICULAR

## 2023-07-27 NOTE — Progress Notes (Signed)
 Patient ID: HUDSEN FEI, male   DOB: [birth year], 89 y.o.   MRN: [id number]  Post op appt #1  Left carpal tunnel release  Sutures were removed Steri-Strips applied.  Fingertips still numb.  Patient has regained his range of motion.  Follow-up in a week to check the distal portion of the wound where the incision had not completely healed  The patient requested an injection in his left knee.  History of chronic knee pain previous injections done by Dr. Hilda Lias  Procedure note left knee injection   verbal consent was obtained to inject left knee joint  Timeout was completed to confirm the site of injection  The medications used were depomedrol 40 mg and 1% lidocaine 3 cc Anesthesia was provided by ethyl chloride and the skin was prepped with alcohol.  After cleaning the skin with alcohol a 20-gauge needle was used to inject the left knee joint. There were no complications. A sterile bandage was applied.

## 2023-08-02 DIAGNOSIS — K080 Exfoliation of teeth due to systemic causes: Secondary | ICD-10-CM | POA: Diagnosis not present

## 2023-08-03 ENCOUNTER — Encounter: Payer: Self-pay | Admitting: Orthopedic Surgery

## 2023-08-03 ENCOUNTER — Ambulatory Visit: Admitting: Orthopedic Surgery

## 2023-08-03 DIAGNOSIS — G5602 Carpal tunnel syndrome, left upper limb: Secondary | ICD-10-CM

## 2023-08-03 NOTE — Progress Notes (Signed)
   There were no vitals taken for this visit.  There is no height or weight on file to calculate BMI.  No chief complaint on file.   Encounter Diagnosis  Name Primary?   Carpal tunnel syndrome of left wrist s/p release 07/11/23 Yes    DOI/DOS/ Date: 07/11/23  Improved a little thumb hurts asking for any exercises he can do; feels stiff

## 2023-08-03 NOTE — Progress Notes (Signed)
 Patient ID: Alexander Clayton, male   DOB: 06-06-1927, 89 y.o.   MRN: [id number]  Encounter Diagnosis  Name Primary?   Carpal tunnel syndrome of left wrist s/p release 07/11/23 Yes    Chief Complaint  Patient presents with   Post-op Follow-up    89 year old male had a left carpal tunnel release on July 11, 2023 he came in for wound check today his wound seems to be healing fine.  He has relief of his night pain but his fingers are still numb this is not unexpected  The right side has similar symptoms as well but we have both decided to not proceed with surgery  I gave him a sixpack exercise program to do he will return as needed

## 2023-09-11 DIAGNOSIS — C44319 Basal cell carcinoma of skin of other parts of face: Secondary | ICD-10-CM | POA: Diagnosis not present

## 2023-09-22 DIAGNOSIS — E1129 Type 2 diabetes mellitus with other diabetic kidney complication: Secondary | ICD-10-CM | POA: Diagnosis not present

## 2023-09-29 DIAGNOSIS — E1129 Type 2 diabetes mellitus with other diabetic kidney complication: Secondary | ICD-10-CM | POA: Diagnosis not present

## 2023-09-29 DIAGNOSIS — I10 Essential (primary) hypertension: Secondary | ICD-10-CM | POA: Diagnosis not present

## 2023-10-30 ENCOUNTER — Emergency Department (HOSPITAL_COMMUNITY)
Admission: EM | Admit: 2023-10-30 | Discharge: 2023-10-30 | Discharge status: Against Medical Advice | Source: Ambulatory Visit | Attending: Emergency Medicine | Admitting: Emergency Medicine

## 2023-10-30 ENCOUNTER — Telehealth: Payer: Self-pay | Admitting: Student

## 2023-10-30 ENCOUNTER — Other Ambulatory Visit: Payer: Self-pay

## 2023-10-30 ENCOUNTER — Encounter (HOSPITAL_COMMUNITY): Payer: Self-pay

## 2023-10-30 ENCOUNTER — Emergency Department (HOSPITAL_COMMUNITY)

## 2023-10-30 DIAGNOSIS — Z5321 Procedure and treatment not carried out due to patient leaving prior to being seen by health care provider: Secondary | ICD-10-CM | POA: Insufficient documentation

## 2023-10-30 DIAGNOSIS — R079 Chest pain, unspecified: Secondary | ICD-10-CM | POA: Insufficient documentation

## 2023-10-30 HISTORY — DX: Cerebral infarction, unspecified: I63.9

## 2023-10-30 LAB — COMPREHENSIVE METABOLIC PANEL WITH GFR
ALT: 21 U/L (ref 0–44)
AST: 20 U/L (ref 15–41)
Albumin: 4.1 g/dL (ref 3.5–5.0)
Alkaline Phosphatase: 88 U/L (ref 38–126)
Anion gap: 13 (ref 5–15)
BUN: 32 mg/dL — ABNORMAL HIGH (ref 8–23)
CO2: 24 mmol/L (ref 22–32)
Calcium: 9.5 mg/dL (ref 8.9–10.3)
Chloride: 101 mmol/L (ref 98–111)
Creatinine, Ser: 1.19 mg/dL (ref 0.61–1.24)
GFR, Estimated: 57 mL/min — ABNORMAL LOW (ref >60)
Glucose, Bld: 117 mg/dL — ABNORMAL HIGH (ref 70–99)
Potassium: 3.4 mmol/L — ABNORMAL LOW (ref 3.5–5.1)
Sodium: 138 mmol/L (ref 135–145)
Total Bilirubin: 0.6 mg/dL (ref 0.0–1.2)
Total Protein: 7.5 g/dL (ref 6.5–8.1)

## 2023-10-30 LAB — CBC WITH DIFFERENTIAL/PLATELET
Abs Immature Granulocytes: 0.04 K/uL (ref 0.00–0.07)
Basophils Absolute: 0.1 K/uL (ref 0.0–0.1)
Basophils Relative: 1 %
Eosinophils Absolute: 0.5 K/uL (ref 0.0–0.5)
Eosinophils Relative: 4 %
HCT: 41.6 % (ref 39.0–52.0)
Hemoglobin: 14.4 g/dL (ref 13.0–17.0)
Immature Granulocytes: 0 %
Lymphocytes Relative: 25 %
Lymphs Abs: 3.3 K/uL (ref 0.7–4.0)
MCH: 31.8 pg (ref 26.0–34.0)
MCHC: 34.6 g/dL (ref 30.0–36.0)
MCV: 91.8 fL (ref 80.0–100.0)
Monocytes Absolute: 1.2 K/uL — ABNORMAL HIGH (ref 0.1–1.0)
Monocytes Relative: 9 %
Neutro Abs: 7.9 K/uL — ABNORMAL HIGH (ref 1.7–7.7)
Neutrophils Relative %: 61 %
Platelets: 314 K/uL (ref 150–400)
RBC: 4.53 MIL/uL (ref 4.22–5.81)
RDW: 12.7 % (ref 11.5–15.5)
WBC: 13 K/uL — ABNORMAL HIGH (ref 4.0–10.5)
nRBC: 0 % (ref 0.0–0.2)

## 2023-10-30 LAB — TROPONIN I (HIGH SENSITIVITY): Troponin I (High Sensitivity): 5 ng/L (ref <18)

## 2023-10-30 LAB — BRAIN NATRIURETIC PEPTIDE: B Natriuretic Peptide: 36 pg/mL (ref 0.0–100.0)

## 2023-10-30 NOTE — Telephone Encounter (Signed)
   Pt c/o of Chest Pain: STAT if active CP, including tightness, pressure, jaw pain, radiating pain to shoulder/upper arm/back, CP unrelieved by Nitro. Symptoms reported of SOB, nausea, vomiting, sweating.  1. Are you having CP right now? yes and back pain   2. Are you experiencing any other symptoms (ex. SOB, nausea, vomiting, sweating)? no   3. Is your CP continuous or coming and going? Coming and going   4. Have you taken Nitroglycerin ? no   5. How long have you been experiencing CP? Since about 7:00 pm last night    6. If NO CP at time of call then end call with telling Pt to call back or call 911 if Chest pain returns prior to return call from triage team.

## 2023-10-30 NOTE — Telephone Encounter (Signed)
    Agree with ED recommendations but appears he went to [facility] ED and left before evaluation? Fluid level was normal and Hs Troponin was negative. Please follow-up with the patient tomorrow to see how he is feeling?   Signed, Laymon CHRISTELLA Qua, PA-C 10/30/2023, 9:25 PM

## 2023-10-30 NOTE — Telephone Encounter (Signed)
 Reports yesterday having sharpe, left sided chest pain near breast, rated 7/10. Reports it started around 7 pm last night that lasted till 1 am this morning.Reports taking tylenol  1000 mg this morning and pain resolved after 30 minutes. Reports active dull chest pain that started 1 hour ago rated 2/10 that has radiated to his back on the left side,rated 5/10. Denies dizziness or SOB. Advised that he needed an evaluation in the ED now. Verbalized understanding of plan. Will route to DOD as an FYI.

## 2023-10-30 NOTE — ED Triage Notes (Signed)
 Pt arrived via POV c/o sharp left chest pain that radiates to his back. Pt reports pain began last night and continued into this morning and Pt reports taking 1000mg  Tylenol  around 0100 today with minimal relief. Pt does not take blood thinners. Pt denies SOB.

## 2023-10-31 NOTE — Telephone Encounter (Signed)
 Patient called back and states he does not have any chest pain at present and said he left the ED yesterday as he had waited 4 1/2 hours, was cold and his symptoms resolved.

## 2023-10-31 NOTE — Telephone Encounter (Signed)
 Attempt to reach patient, had to leave message to return call.

## 2023-11-02 DIAGNOSIS — I200 Unstable angina: Secondary | ICD-10-CM | POA: Diagnosis not present

## 2023-11-02 DIAGNOSIS — E119 Type 2 diabetes mellitus without complications: Secondary | ICD-10-CM | POA: Diagnosis not present

## 2023-11-16 DIAGNOSIS — R079 Chest pain, unspecified: Secondary | ICD-10-CM | POA: Diagnosis not present

## 2023-11-16 DIAGNOSIS — I25110 Atherosclerotic heart disease of native coronary artery with unstable angina pectoris: Secondary | ICD-10-CM | POA: Diagnosis not present

## 2023-12-15 ENCOUNTER — Encounter: Payer: Self-pay | Admitting: Radiology

## 2023-12-26 ENCOUNTER — Telehealth: Payer: Self-pay | Admitting: Orthopedic Surgery

## 2023-12-26 NOTE — Telephone Encounter (Signed)
Returned the pt's call, lvm for him to cb

## 2024-01-30 ENCOUNTER — Ambulatory Visit (INDEPENDENT_AMBULATORY_CARE_PROVIDER_SITE_OTHER): Admitting: Physician Assistant

## 2024-01-30 ENCOUNTER — Encounter (INDEPENDENT_AMBULATORY_CARE_PROVIDER_SITE_OTHER): Payer: Self-pay | Admitting: Physician Assistant

## 2024-01-30 VITALS — BP 135/70 | HR 76 | Ht 67.5 in | Wt 192.0 lb

## 2024-01-30 DIAGNOSIS — H6122 Impacted cerumen, left ear: Secondary | ICD-10-CM

## 2024-01-30 DIAGNOSIS — T161XXA Foreign body in right ear, initial encounter: Secondary | ICD-10-CM | POA: Diagnosis not present

## 2024-01-30 NOTE — Progress Notes (Addendum)
 Dear Dr. Sheryle, Here is my assessment for our mutual patient, Alexander Clayton. Thank you for allowing me the opportunity to care for your patient. Please do not hesitate to contact me should you have any other questions. Sincerely, Chyrl Cohen PA-C  Otolaryngology Clinic Note Referring provider: Dr. Sheryle HPI:  Alexander Clayton is a 89 y.o. male kindly referred by Dr. Sheryle   The patient is a 89 year old male presenting today for evaluation of foreign body in the right ear.  The patient notes that he was having physical exam, his primary care provider noted a hearing aid dome in the right ear.  The patient notes a longstanding history of bilateral hearing loss and uses hearing aids no significant changes in his hearing.  He denies any pain, no drainage, no other issues here today.   Independent Review of Additional Tests or Records:  None   PMH/Meds/All/SocHx/FamHx/ROS:   Past Medical History:  Diagnosis Date   Bladder cancer (HCC)    CAD (coronary artery disease)    a. per coronary CTA 09/2018 - managed medically.   CKD (chronic kidney disease), stage III (HCC)    Diabetes mellitus without complication (HCC)    Hepatic steatosis    by CT 08/2018   Hiatal hernia    small by CT 08/2018   Hypercholesteremia    Hypertension    Stroke [facility])    left eye     Past Surgical History:  Procedure Laterality Date   APPENDECTOMY     CARPAL TUNNEL RELEASE Left 07/11/2023   Procedure: CARPAL TUNNEL RELEASE;  Surgeon: Margrette Taft BRAVO, MD;  Location: [facility];  Service: Orthopedics;  Laterality: Left;   HERNIA REPAIR      Family History  Problem Relation Age of Onset   Hypertension Father    Cirrhosis Brother    Diabetes Mellitus II Brother      Social Connections: Not on file      Current Outpatient Medications:    acetaminophen  (TYLENOL ) 500 MG tablet, Take 1,000 mg by mouth 3 (three) times daily. , Disp: , Rfl:    amLODipine  (NORVASC ) 10 MG tablet, Take 10 mg by mouth every  morning. , Disp: , Rfl:    aspirin  EC 81 MG tablet, Take 81 mg by mouth in the morning and at bedtime., Disp: , Rfl:    atorvastatin  (LIPITOR) 20 MG tablet, Take 20 mg by mouth at bedtime. , Disp: , Rfl:    Cholecalciferol (VITAMIN D-3) 125 MCG (5000 UT) TABS, Take 5,000 Units by mouth daily., Disp: , Rfl:    Docusate Sodium  (STOOL SOFTENER) 100 MG capsule, Take 400 mg by mouth daily., Disp: , Rfl:    gabapentin  (NEURONTIN ) 300 MG capsule, Take 300 mg by mouth 2 (two) times a day., Disp: , Rfl:    guaifenesin (HUMIBID E) 400 MG TABS tablet, Take 400 mg by mouth daily., Disp: , Rfl:    indapamide  (LOZOL ) 2.5 MG tablet, Take 2.5 mg by mouth every morning. , Disp: , Rfl:    Insulin  Aspart (NOVOLOG  IJ), Inject 3-5 Units into the skin 2 (two) times daily with a meal. Take 3 units in the morning and 5 units in the evening, I blood sugar is running low at bedtime take 5 units, Disp: , Rfl:    insulin  glargine (LANTUS ) 100 UNIT/ML injection, Inject 0.4 mLs (40 Units total) into the skin daily., Disp: 10 mL, Rfl:    levothyroxine  (SYNTHROID ) 75 MCG tablet, Take 75 mcg by mouth daily before  breakfast., Disp: , Rfl:    losartan  (COZAAR ) 100 MG tablet, Take 100 mg by mouth every morning., Disp: , Rfl:    metFORMIN (GLUCOPHAGE) 500 MG tablet, Take 500 mg by mouth 2 (two) times daily. , Disp: , Rfl:    Multiple Vitamins-Minerals (ICAPS AREDS 2 PO), Take 1 tablet by mouth 2 (two) times a day. , Disp: , Rfl:    Polyethyl Glycol-Propyl Glycol (SYSTANE) 0.4-0.3 % SOLN, Place 1 drop into both eyes daily as needed (Dry eye)., Disp: , Rfl:    Potassium Chloride  ER 20 MEQ TBCR, Take 20 mEq by mouth daily., Disp: , Rfl:    Tart Cherry 1200 MG CAPS, Take 1,200 mg by mouth daily., Disp: , Rfl:    Physical Exam:   BP 135/70 (BP Location: Right Arm, Patient Position: Sitting, Cuff Size: Normal)   Pulse 76   Ht 5' 7.5 (1.715 m)   Wt 192 lb (87.1 kg)   SpO2 94%   BMI 29.63 kg/m   Pertinent Findings  CN II-XII  intact Right external auditory canal with hearing aid dome, left EAC with cerumen impaction Anterior rhinoscopy: Septum midline; bilateral inferior turbinates with mild hypertrophy No lesions of oral cavity/oropharynx; dentition in normal limits No obviously palpable neck masses/lymphadenopathy/thyromegaly No respiratory distress or stridor  Seprately Identifiable Procedures:  Procedure: Bilateral ear microscopy and foreign body removal using microscope (CPT 69200) - Mod 25 Pre-procedure diagnosis: right Ear Foreign body Post-procedure diagnosis: same Indication: right Ear foreign body; given patient's otologic complaints and physical exam, bilateral otologic examination using microscope was performed and ear foreign body removed  Procedure: Patient was placed semi-recumbent. Both ear canals were examined using the microscope with findings above. Cerumen removed on left and on right using suction and currette with improvement in EAC examination and patency. Left: EAC cerumen impaction.  Right: EAC was patent. TM was intact . Middle ear was aerated . Drainage: none Foreign body was removed. There was no trauma to the [facility]. Patient tolerated the procedure well.  Procedure: bilateral ear microscopy and cerumen removal using microscope (CPT [phone]) - Mod 25 Pre-procedure diagnosis: unilateral cerumen impaction left external auditory canal Post-procedure diagnosis: same Indication: bilateral cerumen impaction; given patient's otologic complaints and history as well as for improved and comprehensive examination of external ear and tympanic membrane, bilateral otologic examination using microscope was performed and impacted cerumen removed  Procedure: Patient was placed semi-recumbent. Both ear canals were examined using the microscope with findings above. Cerumen removed from the left external auditory canal using suction and currette with improvement in EAC examination and patency. Left: EAC was  patent. TM was intact . Middle ear was aerated. Drainage: none Right: EAC was patent. TM was intact . Middle ear was aerated . Drainage: none Patient tolerated the procedure well.     Impression & Plans:  Alexander Clayton is a 89 y.o. male with the following   Foreign body right ear-  Earring a dome removed without difficulty, no trauma to the canal, TM intact.  Left EAC cleared of cerumen.  Patient notes improvement in his hearing.  Follow-up as needed.   - f/u PRN   Thank you for allowing me the opportunity to care for your patient. Please do not hesitate to contact me should you have any other questions.  Sincerely, Chyrl Cohen PA-C [facility] Phone: [phone] Fax: [phone]  01/30/2024, 1:30 PM

## 2024-01-31 DIAGNOSIS — K080 Exfoliation of teeth due to systemic causes: Secondary | ICD-10-CM | POA: Diagnosis not present

## 2024-02-12 DIAGNOSIS — E031 Congenital hypothyroidism without goiter: Secondary | ICD-10-CM | POA: Diagnosis not present

## 2024-02-12 DIAGNOSIS — E785 Hyperlipidemia, unspecified: Secondary | ICD-10-CM | POA: Diagnosis not present

## 2024-02-12 DIAGNOSIS — I10 Essential (primary) hypertension: Secondary | ICD-10-CM | POA: Diagnosis not present

## 2024-02-12 DIAGNOSIS — E1129 Type 2 diabetes mellitus with other diabetic kidney complication: Secondary | ICD-10-CM | POA: Diagnosis not present

## 2024-02-26 ENCOUNTER — Encounter: Payer: Self-pay | Admitting: Radiology

## 2024-03-07 DIAGNOSIS — C672 Malignant neoplasm of lateral wall of bladder: Secondary | ICD-10-CM | POA: Diagnosis not present

## 2024-03-07 DIAGNOSIS — N3289 Other specified disorders of bladder: Secondary | ICD-10-CM | POA: Diagnosis not present

## 2024-04-04 DIAGNOSIS — H43813 Vitreous degeneration, bilateral: Secondary | ICD-10-CM | POA: Diagnosis not present

## 2024-04-04 DIAGNOSIS — H353134 Nonexudative age-related macular degeneration, bilateral, advanced atrophic with subfoveal involvement: Secondary | ICD-10-CM | POA: Diagnosis not present

## 2024-04-04 DIAGNOSIS — E119 Type 2 diabetes mellitus without complications: Secondary | ICD-10-CM | POA: Diagnosis not present

## 2024-04-04 DIAGNOSIS — H0100A Unspecified blepharitis right eye, upper and lower eyelids: Secondary | ICD-10-CM | POA: Diagnosis not present

## 2024-04-04 DIAGNOSIS — E1129 Type 2 diabetes mellitus with other diabetic kidney complication: Secondary | ICD-10-CM | POA: Diagnosis not present

## 2024-05-02 ENCOUNTER — Encounter: Payer: Self-pay | Admitting: Orthopedic Surgery

## 2024-05-02 ENCOUNTER — Ambulatory Visit: Admitting: Orthopedic Surgery

## 2024-05-02 VITALS — BP 111/71 | HR 84 | Wt 192.0 lb

## 2024-05-02 DIAGNOSIS — M170 Bilateral primary osteoarthritis of knee: Secondary | ICD-10-CM | POA: Diagnosis not present

## 2024-05-02 DIAGNOSIS — M1711 Unilateral primary osteoarthritis, right knee: Secondary | ICD-10-CM

## 2024-05-02 DIAGNOSIS — R269 Unspecified abnormalities of gait and mobility: Secondary | ICD-10-CM | POA: Insufficient documentation

## 2024-05-02 DIAGNOSIS — M25562 Pain in left knee: Secondary | ICD-10-CM

## 2024-05-02 DIAGNOSIS — G8929 Other chronic pain: Secondary | ICD-10-CM

## 2024-05-02 DIAGNOSIS — M25561 Pain in right knee: Secondary | ICD-10-CM

## 2024-05-02 DIAGNOSIS — M1712 Unilateral primary osteoarthritis, left knee: Secondary | ICD-10-CM

## 2024-05-02 MED ORDER — METHYLPREDNISOLONE ACETATE 40 MG/ML IJ SUSP
40.0000 mg | Freq: Once | INTRAMUSCULAR | Status: AC
  Administered 2024-05-02: 40 mg via INTRA_ARTICULAR

## 2024-05-02 NOTE — Patient Instructions (Signed)

## 2024-05-02 NOTE — Addendum Note (Signed)
 Addended byBETHA JENEAN GREIG LELON on: 05/02/2024 10:43 AM   Modules accepted: Orders

## 2024-05-02 NOTE — Progress Notes (Signed)
" ° °  Patient: Alexander Clayton           Date of Birth: 1927/11/08           MRN: [id number] Visit Date: 05/02/2024 Requested by: Sheryle Carwin, MD [address] [place],  [place] [address] PCP: Sheryle Carwin, MD  Encounter Diagnoses  Name Primary?   Chronic pain of left knee Yes   Primary osteoarthritis of left knee    Primary osteoarthritis of right knee     Assessment and plan:  89 year old male history of diabetes had been getting gel shots in [place] but they told him he could no longer get them.  He saw Dr. Brenna who gave him a left knee injection it worked well.  He is here today to see what else he can have done.  He is ambulatory with a cane and we reinjected both knees and we will consider him for repeat gel injection   Procedure note for bilateral knee injections  Procedure note left knee injection verbal consent was obtained to inject left knee joint  Timeout was completed to confirm the site of injection  Medication  40 mg depomedrol and 3 cc of 1% lidocaine   Anesthesia was provided by ethyl chloride and the skin was prepped with alcohol.  After cleaning the skin with alcohol a 20-gauge needle was used to inject the left knee joint. There were no complications. A sterile bandage was applied.   Procedure note right knee injection verbal consent was obtained to inject right knee joint  Timeout was completed to confirm the site of injection  Medication  40 mg depomedrol and 3 cc of 1% lidocaine   Anesthesia was provided by ethyl chloride and the skin was prepped with alcohol.  After cleaning the skin with alcohol a 20-gauge needle was used to inject the right knee joint. There were no complications. A sterile bandage was applied.   Meds ordered this encounter  Medications   methylPREDNISolone  acetate (DEPO-MEDROL ) injection 40 mg   methylPREDNISolone  acetate (DEPO-MEDROL ) injection 40 mg     Chief Complaint  Patient presents with   Knee Pain    Both      History:  89 year old male bilateral knee pain left greater than right history of prior gel injections and cortisone injections left knee.  He was released from [facility] after gel injections.  He said they told him they could no longer help him.  He went to Dr. Brenna who gave him a left knee injection and got good relief he is here for further management  Focused exam findings:  The patient is ambulatory with a cane  He has tenderness in the medial joint lines of both knee mild bilateral varus deformity  Limited range of motion without instability    No results found.    "

## 2024-05-02 NOTE — Progress Notes (Signed)
" °  Intake history:  Chief Complaint  Patient presents with   Knee Pain    Both      BP 111/71   Pulse 84   Wt 192 lb (87.1 kg)   BMI 29.63 kg/m  Body mass index is 29.63 kg/m.  Pharmacy? __________[facility]____________  WHAT ARE WE SEEING YOU FOR TODAY?   Both knees  How long has this bothered you? (DOI?DOS?WS?) left for 9 years right for 6 m R Knee gives out  Was there an injury? No  Anticoag.  No   Any ALLERGIES _________Allergies[1] _____________________________________   Treatment:  Have you taken:  Tylenol  Yes  Advil  No  Had PT No  Had injection Yes left knee gel shots at [facility] MARLA did cortizone lasted 6 mo . Dr VEAR did cortizone lasted 1 mo.  Other  _________________________          [1]  Allergies Allergen Reactions   Rofecoxib Hives, Dermatitis, Rash and Other (See Comments)    Sweating    Ciprofloxacin Hives and Rash   "

## 2024-05-31 ENCOUNTER — Encounter: Payer: Self-pay | Admitting: Gastroenterology

## 2024-07-09 ENCOUNTER — Ambulatory Visit: Admitting: Gastroenterology

## 2024-07-25 ENCOUNTER — Ambulatory Visit: Admitting: Student
